# Patient Record
Sex: Male | Born: 1985 | Race: White | Hispanic: No | Marital: Single | Smoking: Never smoker
Health system: Southern US, Community
[De-identification: ages and names within clinical notes are randomized; demographics above are authoritative.]

## PROBLEM LIST (undated history)

## (undated) ENCOUNTER — Emergency Department (HOSPITAL_COMMUNITY): Payer: BLUE CROSS/BLUE SHIELD | Source: Home / Self Care

## (undated) DIAGNOSIS — B019 Varicella without complication: Secondary | ICD-10-CM

## (undated) DIAGNOSIS — F909 Attention-deficit hyperactivity disorder, unspecified type: Secondary | ICD-10-CM

## (undated) HISTORY — DX: Attention-deficit hyperactivity disorder, unspecified type: F90.9

## (undated) HISTORY — DX: Varicella without complication: B01.9

---
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
## 2005-10-09 ENCOUNTER — Ambulatory Visit: Payer: Self-pay | Admitting: Internal Medicine

## 2012-10-24 ENCOUNTER — Emergency Department (HOSPITAL_COMMUNITY): Payer: BC Managed Care – PPO

## 2012-10-24 ENCOUNTER — Emergency Department (HOSPITAL_COMMUNITY)
Admission: EM | Admit: 2012-10-24 | Discharge: 2012-10-24 | Disposition: A | Discharge status: Home / Self Care | Payer: BC Managed Care – PPO | Attending: Emergency Medicine | Admitting: Emergency Medicine

## 2012-10-24 ENCOUNTER — Encounter (HOSPITAL_COMMUNITY): Payer: Self-pay | Admitting: *Deleted

## 2012-10-24 DIAGNOSIS — S20212A Contusion of left front wall of thorax, initial encounter: Secondary | ICD-10-CM

## 2012-10-24 DIAGNOSIS — Y9389 Activity, other specified: Secondary | ICD-10-CM | POA: Insufficient documentation

## 2012-10-24 DIAGNOSIS — S20219A Contusion of unspecified front wall of thorax, initial encounter: Secondary | ICD-10-CM | POA: Insufficient documentation

## 2012-10-24 DIAGNOSIS — Y999 Unspecified external cause status: Secondary | ICD-10-CM | POA: Insufficient documentation

## 2012-10-24 DIAGNOSIS — Y92410 Unspecified street and highway as the place of occurrence of the external cause: Secondary | ICD-10-CM | POA: Insufficient documentation

## 2012-10-24 MED ORDER — METHOCARBAMOL 500 MG PO TABS: 500.0000 mg | ORAL_TABLET | Freq: Two times a day (BID) | ORAL | Status: DC

## 2012-10-24 MED ORDER — OXYCODONE-ACETAMINOPHEN 5-325 MG PO TABS: 2.0000 | ORAL_TABLET | ORAL | Status: DC | PRN

## 2012-10-24 MED ORDER — IBUPROFEN 600 MG PO TABS: 600.0000 mg | ORAL_TABLET | Freq: Four times a day (QID) | ORAL | Status: DC | PRN

## 2012-10-24 NOTE — ED Notes (Signed)
Patient is alert and oriented x3.  He was given DC instructions and follow up visit instructions.  Patient gave verbal understanding.  He was DC ambulatory under his own power to home.  V/S stable.  He was not showing any signs of distress on DC 
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
## 2014-07-21 ENCOUNTER — Encounter (HOSPITAL_COMMUNITY): Payer: Self-pay | Admitting: *Deleted

## 2014-07-21 ENCOUNTER — Emergency Department (HOSPITAL_COMMUNITY)
Admission: EM | Admit: 2014-07-21 | Discharge: 2014-07-21 | Disposition: A | Discharge status: Home / Self Care | Payer: BLUE CROSS/BLUE SHIELD | Attending: Emergency Medicine | Admitting: Emergency Medicine

## 2014-07-21 DIAGNOSIS — S65402A Unspecified injury of blood vessel of left thumb, initial encounter: Secondary | ICD-10-CM | POA: Diagnosis present

## 2014-07-21 DIAGNOSIS — Z79899 Other long term (current) drug therapy: Secondary | ICD-10-CM | POA: Diagnosis not present

## 2014-07-21 DIAGNOSIS — Y998 Other external cause status: Secondary | ICD-10-CM | POA: Diagnosis not present

## 2014-07-21 DIAGNOSIS — S61219A Laceration without foreign body of unspecified finger without damage to nail, initial encounter: Secondary | ICD-10-CM

## 2014-07-21 DIAGNOSIS — Y9389 Activity, other specified: Secondary | ICD-10-CM | POA: Diagnosis not present

## 2014-07-21 DIAGNOSIS — Y9289 Other specified places as the place of occurrence of the external cause: Secondary | ICD-10-CM | POA: Insufficient documentation

## 2014-07-21 DIAGNOSIS — W260XXA Contact with knife, initial encounter: Secondary | ICD-10-CM | POA: Insufficient documentation

## 2014-07-21 DIAGNOSIS — S61012A Laceration without foreign body of left thumb without damage to nail, initial encounter: Secondary | ICD-10-CM | POA: Diagnosis not present

## 2014-07-21 NOTE — ED Notes (Signed)
Thumb soaking in betadine and saline solution

## 2014-07-21 NOTE — ED Notes (Signed)
Pt c/o laceration to Left thumb; reports slicing a piece of bread when he cut his finger. ~1-2 cm lac noted to thumb, minimal bleeding noted

## 2014-07-21 NOTE — ED Notes (Signed)
The pt is c/o  A laceration to the lt thumb.  He did 30 minutes ago with a knife.  Minimal bleeding

## 2014-07-21 NOTE — Discharge Instructions (Signed)
Laceration Care, Adult °A laceration is a cut or lesion that goes through all layers of the skin and into the tissue just beneath the skin. °TREATMENT  °Some lacerations may not require closure. Some lacerations may not be able to be closed due to an increased risk of infection. It is important to see your caregiver as soon as possible after an injury to minimize the risk of infection and maximize the opportunity for successful closure. °If closure is appropriate, pain medicines may be given, if needed. The wound will be cleaned to help prevent infection. Your caregiver will use stitches (sutures), staples, wound glue (adhesive), or skin adhesive strips to repair the laceration. These tools bring the skin edges together to allow for faster healing and a better cosmetic outcome. However, all wounds will heal with a scar. Once the wound has healed, scarring can be minimized by covering the wound with sunscreen during the day for 1 full year. °HOME CARE INSTRUCTIONS  °For sutures or staples: °· Keep the wound clean and dry. °· If you were given a bandage (dressing), you should change it at least once a day. Also, change the dressing if it becomes wet or dirty, or as directed by your caregiver. °· Wash the wound with soap and water 2 times a day. Rinse the wound off with water to remove all soap. Pat the wound dry with a clean towel. °· After cleaning, apply a thin layer of the antibiotic ointment as recommended by your caregiver. This will help prevent infection and keep the dressing from sticking. °· You may shower as usual after the first 24 hours. Do not soak the wound in water until the sutures are removed. °· Only take over-the-counter or prescription medicines for pain, discomfort, or fever as directed by your caregiver. °· Get your sutures or staples removed as directed by your caregiver. °For skin adhesive strips: °· Keep the wound clean and dry. °· Do not get the skin adhesive strips wet. You may bathe  carefully, using caution to keep the wound dry. °· If the wound gets wet, pat it dry with a clean towel. °· Skin adhesive strips will fall off on their own. You may trim the strips as the wound heals. Do not remove skin adhesive strips that are still stuck to the wound. They will fall off in time. °For wound adhesive: °· You may briefly wet your wound in the shower or bath. Do not soak or scrub the wound. Do not swim. Avoid periods of heavy perspiration until the skin adhesive has fallen off on its own. After showering or bathing, gently pat the wound dry with a clean towel. °· Do not apply liquid medicine, cream medicine, or ointment medicine to your wound while the skin adhesive is in place. This may loosen the film before your wound is healed. °· If a dressing is placed over the wound, be careful not to apply tape directly over the skin adhesive. This may cause the adhesive to be pulled off before the wound is healed. °· Avoid prolonged exposure to sunlight or tanning lamps while the skin adhesive is in place. Exposure to ultraviolet light in the first year will darken the scar. °· The skin adhesive will usually remain in place for 5 to 10 days, then naturally fall off the skin. Do not pick at the adhesive film. °You may need a tetanus shot if: °· You cannot remember when you had your last tetanus shot. °· You have never had a tetanus   shot. If you get a tetanus shot, your arm may swell, get red, and feel warm to the touch. This is common and not a problem. If you need a tetanus shot and you choose not to have one, there is a rare chance of getting tetanus. Sickness from tetanus can be serious. SEEK MEDICAL CARE IF:   You have redness, swelling, or increasing pain in the wound.  You see a red line that goes away from the wound.  You have yellowish-white fluid (pus) coming from the wound.  You have a fever.  You notice a bad smell coming from the wound or dressing.  Your wound breaks open before or  after sutures have been removed.  You notice something coming out of the wound such as wood or glass.  Your wound is on your hand or foot and you cannot move a finger or toe. SEEK IMMEDIATE MEDICAL CARE IF:   Your pain is not controlled with prescribed medicine.  You have severe swelling around the wound causing pain and numbness or a change in color in your arm, hand, leg, or foot.  Your wound splits open and starts bleeding.  You have worsening numbness, weakness, or loss of function of any joint around or beyond the wound.  You develop painful lumps near the wound or on the skin anywhere on your body. MAKE SURE YOU:   Understand these instructions.  Will watch your condition.  Will get help right away if you are not doing well or get worse. Document Released: 05/18/2005 Document Revised: 08/10/2011 Document Reviewed: 11/11/2010 [facility] Patient Information 2015 [place], [place]. This information is not intended to replace advice given to you by your health care provider. Make sure you discuss any questions you have with your health care provider.   Emergency Department Resource Guide 1) Find a Doctor and Pay Out of Pocket Although you won't have to find out who is covered by your insurance plan, it is a good idea to ask around and get recommendations. You will then need to call the office and see if the doctor you have chosen will accept you as a new patient and what types of options they offer for patients who are self-pay. Some doctors offer discounts or will set up payment plans for their patients who do not have insurance, but you will need to ask so you aren't surprised when you get to your appointment.  2) Contact Your Local Health Department Not all health departments have doctors that can see patients for sick visits, but many do, so it is worth a call to see if yours does. If you don't know where your local health department is, you can check in your phone book. The CDC  also has a tool to help you locate your state's health department, and many state websites also have listings of all of their local health departments.  3) Find a [facility] If your illness is not likely to be very severe or complicated, you may want to try a walk in clinic. These are popping up all over the country in pharmacies, drugstores, and shopping centers. They're usually staffed by nurse practitioners or physician assistants that have been trained to treat common illnesses and complaints. They're usually fairly quick and inexpensive. However, if you have serious medical issues or chronic medical problems, these are probably not your best option.  No Primary Care Doctor: - Call Health Connect at  [phone] - they can help you locate a primary care doctor  that  accepts your insurance, provides certain services, etc. - Physician Referral Service- [phone]  Chronic Pain Problems: Organization         Address  Phone   Notes  [facility]  [phone] Patients need to be referred by their primary care doctor.   Medication Assistance: Organization         Address  Phone   Notes  [facility] Medication [facility] [address] [place], [place] [address] [phone] --Must be a resident of [facility] -- Must have NO insurance coverage whatsoever (no Medicaid/ Medicare, etc.) -- The pt. MUST have a primary care doctor that directs their care regularly and follows them in the community   MedAssist  [phone]   [employer]  [phone]    Agencies that provide inexpensive medical care: Organization         Address  Phone   Notes  [place]  [phone]   [facility]    [phone]   [facility] [place], [place] [address] [phone]   [place] [address], [place] [phone]   Planned Parenthood    [phone]   [facility]    [phone]   [place] and [place] [address], [place] Phone:  [phone], Fax:  [phone] Hours of Operation:  9 am - 6 pm, M-F.  Also accepts Medicaid/Medicare and self-pay.  [facility] for [place] [address], [address], [place] Phone: [phone], Fax: [phone]. Hours of Operation:  8:30 am - 5:30 pm, M-F.  Also accepts Medicaid and self-pay.  [facility] [place] [address] [place] Phone: [phone]   [place], [place], [place] [phone], Ext. [phone] Mondays & Thursdays: 7-9 AM.  First 15 patients are seen on a first come, first serve basis.    [place] Providers:  Organization         Address  Phone   Notes  [facility] [address], [place] [phone] Also accepts self-pay patients.  [facility] [address], [place]  [phone]   [place], [address], [place] [phone]   [facility] [address] [place] [phone]   Lucianne Lei [address], [place]   [phone] Only accepts [place] Access [place] patients after they have their name applied to their card.   Self-Pay (no insurance) in [facility]:  Organization         Address  Phone   Notes  Sickle Cell Patients, [facility] [place] [phone]   [facility] Urgent Care [place] [phone]   [facility] [place]  [place], [place], [place] [phone]   [facility]/Dr. Osei-Bonsu  [address] [place] or [address], [place] [phone] Phone number for both [place] and [place] locations is the same.  Urgent Medical and [facility] [address] [place] [phone]   [facility] [address], [place] or [address] [phone]  [facility] [place] [phone], phone; [phone], fax Sees patients 1st and 3rd Saturday of every month.  Must not qualify for public or private insurance (i.e. Medicaid, Medicare, [place] Health Choice, Veterans' Benefits)  Household income should be no more than 200% of the poverty level The clinic cannot treat you if you are pregnant or think you are pregnant  Sexually transmitted diseases are not treated at the clinic.    Dental Care: Organization         Address  Phone  Notes  [facility] Department of [facility] [place] [phone] Accepts children up to age 27 who are enrolled in [place] or [place]; pregnant women with a Medicaid card; and children who have applied for Medicaid or [place] Health Choice, but were declined, whose parents can pay a reduced fee at time of service.  [facility] Department of [facility]  [address], [place] [phone] Accepts children up to age 77 who are enrolled in [place] or [place]; pregnant women with a Medicaid card; and children who have applied for Medicaid or [place] Health Choice, but were declined, whose parents can pay a reduced fee at time of service.  [facility] Adult [facility] Access PRO[facility]  [place] [phone] Patients are seen by appointment only. Walk-ins are not accepted. [place] will see patients 102 years of age and older. Monday - Tuesday (8am-5pm) Most Wednesdays (8:30-5pm) $30 per visit, cash only  [facility] Adult Dental Access PROGRAM  [address], [facility] [phone] Patients are seen by appointment only. Walk-ins are not accepted. [place] will see patients 75 years of age and older. One  Wednesday Evening (Monthly: Volunteer Based).  $30 per visit, cash only  [place]  [phone] for adults; Children under age 59, call Graduate Pediatric Dentistry at [phone]. Children aged 56-14, please call [phone] to request a pediatric application.  Dental services are provided in all areas of dental care including fillings, crowns and bridges, complete and partial dentures, implants, gum treatment, root canals, and extractions. Preventive care is also provided. Treatment is provided to both adults and children. Patients are selected via a lottery and there is often a waiting list.   [facility] [address] [place]  [phone] [email or web address]   [facility] [address] [place], [place] [phone], Ext. [phone] Second and Fourth Thursday of each month, opens at 6:30 AM; Clinic ends at 9 AM.  Patients are seen on a first-come first-served basis, and a limited number are seen during each clinic.   [facility]  [address] [place], [place] [phone]   Eligibility Requirements You must have lived in [place], [place], or [place] counties for at least the last three months.   You cannot be eligible for state or federal sponsored [employer], including [employer], [place], or [employer].   You generally cannot be eligible for healthcare insurance through your employer.    How to apply: Eligibility screenings are held every Tuesday and Wednesday afternoon from 1:00 pm until 4:00 pm. You do not need an appointment for the interview!  [facility] [address] [place], [place]   [place]  Painter Department  [place] Department  [place] in the Community: Intensive Outpatient Programs Organization          Address  Phone  Notes  [place]. [address], [place], [place] [phone]   [facility] Outpatient [address], [place], [place]   ADS: Alcohol & Drug Svcs [address] [place], [place]   [place] [address]  [place], [place] or [phone]   Substance Abuse Resources Organization         Address  Phone  Notes  Alcohol and Drug Services  [phone]   [place]  [phone]   [facility] [place]   Chinita Pester  [phone]   Residential & Outpatient Substance Abuse Program  [phone]   Psychological Services Organization         Address  Phone  Notes  [facility]  [place]  [phone]   [place] [address] [place] or [phone]    Mobile Crisis Teams Organization         Address  Phone  Notes  [facility], Mobile Crisis Care Unit  [phone]   [facility]  [address] [place], [place]   [place] [address] [place] [phone]    Self-Help/Support Groups Organization         Address  Phone             Notes  [place]. of [place] - variety of support groups  [place] Call for more information  Narcotics Anonymous (NA), [facility] [address], [employer]   2 meetings at this location   Special educational needs teacher         Address  Phone  Notes  [facility] [place],    [place]  [phone]   [facility]  [address], [place] [address], [place], [place]   [place], [place] [phone] Admissions: 8am-3pm M-F  Incentives Substance [place] [address],    [place], [place] [phone]   [facility] [address] [place], [place], [place]   [facility] [address],  [place], [place]   [facility] Programs - Intensive Outpatient [address] Kristeen Mans [address], [place], [place]   [facility] ([place].,  [place], [place] [phone] or [phone]   [facility] Services (RTS) [address] [place], [place] Accepts Medicaid  [facility] [address]  [place] [phone] Substance Abuse/Addiction Treatment   [facility] Organization         Address  Phone  Notes  [facility]  [phone]   Domenic Schwab, PhD [address] [place]lis [place], [place]   [phone] or [phone]   [place], [place] [phone]   [place] [address] [place], [place] [phone] Insurance/Medicaid/sponsorship through [employer] and Families [address], [place]                                    [place], [place] [phone] [place] [phone]  [address], [place] [phone]    Dr. Adele Schilder  [phone]   [facility] Dept. 1) [address] [place] 2) [place] 3)  [place] [address], [place] [phone]  [phone]   [place] [phone] or [phone] (After Hours)

## 2014-07-21 NOTE — ED Provider Notes (Signed)
CSN: [id number]     Arrival date & time 07/21/14  2229 History  This chart was scribed for a non-physician practitioner, Starlyn Skeans, PA-C working with Evelina Bucy, MD by Martinique Peace, ED Scribe. The patient was seen in TR07C/TR07C. The patient's care was started at 10:48 PM.     Chief Complaint  Patient presents with  . Laceration     Patient is a 29 y.o. male presenting with skin laceration. The history is provided by the patient. No language interpreter was used.  Laceration Location:  Hand Hand laceration location:  L finger (left thumb) Time since incident:  30 minutes Laceration mechanism:  Knife Tetanus status:  Up to date  HPI Comments: Brian Herrera is a 29 y.o. male who presents to the Emergency Department complaining of laceration to left thumb onset 30 minutes ago that occurred while pt was slicing a piece of bread, bleeding is controlled. Pt is left hand dominant. Rates pain as 2/10 currently. Pt reports some tingling to tip of thumb where laceration occurred. Tetanus vaccination is up to date. Pt is non-smoker.    History reviewed. No pertinent past medical history. History reviewed. No pertinent past surgical history. No family history on file. History  Substance Use Topics  . Smoking status: Never Smoker   . Smokeless tobacco: Not on file  . Alcohol Use: Yes     Comment: socially    Review of Systems  Constitutional: Negative for fever and chills.  Gastrointestinal: Negative for vomiting.  Skin: Positive for wound.       Laceration to tip of left thumb.   All other systems reviewed and are negative.     Allergies  Review of patient's allergies indicates not on file.  Home Medications   Prior to Admission medications   Medication Sig Start Date End Date Taking? Authorizing Provider  ibuprofen (ADVIL,MOTRIN) 600 MG tablet Take 1 tablet (600 mg total) by mouth every 6 (six) hours as needed for pain. 10/24/12   Leota Jacobsen, MD  methocarbamol  (ROBAXIN) 500 MG tablet Take 1 tablet (500 mg total) by mouth 2 (two) times daily. 10/24/12   Leota Jacobsen, MD  oxyCODONE-acetaminophen (PERCOCET/ROXICET) 5-325 MG per tablet Take 2 tablets by mouth every 4 (four) hours as needed for pain. 10/24/12   Leota Jacobsen, MD   BP 134/88 mmHg  Pulse 53  Temp(Src) 98.1 F (36.7 C) (Oral)  Resp 16  Ht 5\' 5"  (1.651 m)  Wt 195 lb (88.451 kg)  BMI 32.45 kg/m2  SpO2 98% Physical Exam  Constitutional: He is oriented to person, place, and time. He appears well-developed and well-nourished. No distress.  HENT:  Head: Normocephalic and atraumatic.  Eyes: Conjunctivae and EOM are normal.  Neck: Neck supple. No tracheal deviation present.  Cardiovascular: Normal rate, regular rhythm, normal heart sounds and intact distal pulses.  Exam reveals no gallop and no friction rub.   No murmur heard. Pulmonary/Chest: Effort normal and breath sounds normal. No respiratory distress. He has no wheezes. He has no rales. He exhibits no tenderness.  Musculoskeletal: Normal range of motion.       Left hand: He exhibits laceration. He exhibits normal range of motion, no tenderness, no bony tenderness, normal two-point discrimination, normal capillary refill, no deformity and no swelling. Normal sensation noted. Normal strength noted.       Hands: There is full flexion and extension of the thumb with resistance.  Neurological: He is alert and oriented to person, place,  and time.  Skin: Skin is warm and dry.  Psychiatric: He has a normal mood and affect. His behavior is normal. Judgment and thought content normal.  Nursing note and vitals reviewed.   ED Course  Procedures (including critical care time) Labs Review Labs Reviewed - No data to display  Imaging Review No results found.   EKG Interpretation None     Medications - No data to display  10:52 PM- Treatment plan was discussed with patient who verbalizes understanding and agrees.   MDM   Final  diagnoses:  Finger laceration, initial encounter   Patient's 29 year old male who presents emergency room for evaluation of laceration to the thumb. Laceration is clean. There appears to be no tendinous or nerve involvement. It is neurovascularly intact. Patient's up-to-date on his vaccinations and tetanus. Patient has full flexion and extension of thumb. Wound appears to be relatively superficial. Wound was soaked here in the ED and Betadine solution. Wound was closed with Dermabond as seen above. Patient return for signs of infection which we discussed. Patient states understanding and agreement. Patient stable for discharge.  I personally performed the services described in this documentation, which was scribed in my presence. The recorded information has been reviewed and is accurate.       Cherylann Parr, PA-C 07/21/14 2347  Evelina Bucy, MD 07/21/14 [phone]

## 2014-12-18 ENCOUNTER — Encounter: Payer: Self-pay | Admitting: Family

## 2014-12-18 ENCOUNTER — Ambulatory Visit (INDEPENDENT_AMBULATORY_CARE_PROVIDER_SITE_OTHER): Payer: BLUE CROSS/BLUE SHIELD | Admitting: Family

## 2014-12-18 VITALS — BP 128/80 | HR 66 | Temp 98.2°F | Resp 18 | Ht 67.0 in | Wt 184.0 lb

## 2014-12-18 DIAGNOSIS — D179 Benign lipomatous neoplasm, unspecified: Secondary | ICD-10-CM

## 2014-12-18 DIAGNOSIS — Z0000 Encounter for general adult medical examination without abnormal findings: Secondary | ICD-10-CM | POA: Insufficient documentation

## 2014-12-18 NOTE — Assessment & Plan Note (Addendum)
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 Continue to monitor at this time. Follow-up for changes in size or worsening.

## 2014-12-18 NOTE — Assessment & Plan Note (Signed)
1) Anticipatory Guidance: Discussed importance of wearing a seatbelt while driving and not texting while driving; changing batteries in smoke detector at least once annually; wearing suntan lotion when outside; eating a balanced and moderate diet; getting physical activity at least 30 minutes per day.  2) Immunizations / Screenings / Labs:  All immunizations are up-to-date per recommendations. Due for a dental and vision screen which will be scheduled independently. All other screenings are up-to-date per recommendations. Obtain CBC, BMET, Lipid profile and TSH.   Overall well exam with minimal risk factors for cardiovascular disease. BMI indicates slight overweight. Discussed goal of losing approximately 5% of current body weight through continued nutrition and physical activity. Follow-up prevention exam in one year. Follow-up office visit pending blood work.

## 2014-12-18 NOTE — Progress Notes (Signed)
Subjective:    Patient ID: Brian Herrera, male    DOB: [birth year], 29 y.o.   MRN: [id number]  Chief Complaint  Patient presents with  . Establish Care    has a mass on the back on his left leg, noticed a couple weeks ago    HPI:  Brian Herrera is a 29 y.o. male who presents today for an annual wellness visit.   1) Health Maintenance -   Diet - Averages about 2-3 meals per day consisting of fruits, vegetables, protein, lean meats; 2-3 cups of caffeine per day  Exercise - Daily consisting of walking, running and resistance training .   2) Preventative Exams / Immunizations:  Dental -- Due for exam  Vision -- Due for exam   There are no preventive care reminders to display for this patient.   There is no immunization history on file for this patient.   3.) Masses - Associated symptom of masses located on left leg, left gluteus, and right bicep which has been going on for about 2 weeks. Denies any changes in size. Described as soft and moveable. Denies any tenderness. Denies treatments that make it better or worse.   No Known Allergies   Outpatient Prescriptions Prior to Visit  Medication Sig Dispense Refill  . ibuprofen (ADVIL,MOTRIN) 600 MG tablet Take 1 tablet (600 mg total) by mouth every 6 (six) hours as needed for pain. 30 tablet 0  . methocarbamol (ROBAXIN) 500 MG tablet Take 1 tablet (500 mg total) by mouth 2 (two) times daily. 20 tablet 0  . oxyCODONE-acetaminophen (PERCOCET/ROXICET) 5-325 MG per tablet Take 2 tablets by mouth every 4 (four) hours as needed for pain. 10 tablet 0   No facility-administered medications prior to visit.     Past Medical History  Diagnosis Date  . Chicken pox   . ADHD (attention deficit hyperactivity disorder)     Dx around age 62     History reviewed. No pertinent past surgical history.   Family History  Problem Relation Age of Onset  . Arthritis Mother   . Diabetes Father   . Arthritis Maternal Grandmother   .  Hypertension Maternal Grandmother   . Mental illness Maternal Grandfather   . Hypertension Paternal Grandmother   . Diabetes Paternal Grandmother   . Alcohol abuse Paternal Grandfather   . Stroke Paternal Grandfather   . Diabetes Paternal Grandfather      History   Social History  . Marital Status: Single    Spouse Name: N/A  . Number of Children: N/A  . Years of Education: 16   Occupational History  . Nurse Tech    . Student     [employer]ing School   Social History Main Topics  . Smoking status: Never Smoker   . Smokeless tobacco: Never Used  . Alcohol Use: Yes     Comment: socially  . Drug Use: No  . Sexual Activity: Not on file   Other Topics Concern  . Not on file   Social History Narrative   Fun: Paintball, exercise   Denies religious beliefs effecting health care.      Review of Systems  Constitutional: Denies fever, chills, fatigue, or significant weight gain/loss. HENT: Head: Denies headache or neck pain Ears: Denies changes in hearing, ringing in ears, earache, drainage Nose: Denies discharge, stuffiness, itching, nosebleed, sinus pain Throat: Denies sore throat, hoarseness, dry mouth, sores, thrush Eyes: Denies loss/changes in vision, pain, redness, blurry/double vision, flashing lights Cardiovascular: Denies  chest pain/discomfort, tightness, palpitations, shortness of breath with activity, difficulty lying down, swelling, sudden awakening with shortness of breath Respiratory: Denies shortness of breath, cough, sputum production, wheezing Gastrointestinal: Denies dysphasia, heartburn, change in appetite, nausea, change in bowel habits, rectal bleeding, constipation, diarrhea, yellow skin or eyes Genitourinary: Denies frequency, urgency, burning/pain, blood in urine, incontinence, change in urinary strength. Musculoskeletal: Denies muscle/joint pain, stiffness, back pain, redness or swelling of joints, trauma Skin: Denies rashes, lumps, itching, dryness, color  changes, or hair/nail changes Neurological: Denies dizziness, fainting, seizures, weakness, numbness, tingling, tremor Psychiatric - Denies nervousness, stress, depression or memory loss Endocrine: Denies heat or cold intolerance, sweating, frequent urination, excessive thirst, changes in appetite Hematologic: Denies ease of bruising or bleeding     Objective:     BP 128/80 mmHg  Pulse 66  Temp(Src) 98.2 F (36.8 C) (Oral)  Resp 18  Ht 5\' 7"  (1.702 m)  Wt 184 lb (83.462 kg)  BMI 28.81 kg/m2  SpO2 98% Nursing note and vital signs reviewed.  Physical Exam  Constitutional: He is oriented to person, place, and time. He appears well-developed and well-nourished.  HENT:  Head: Normocephalic.  Right Ear: Hearing, tympanic membrane, external ear and ear canal normal.  Left Ear: Hearing, tympanic membrane, external ear and ear canal normal.  Nose: Nose normal.  Mouth/Throat: Uvula is midline, oropharynx is clear and moist and mucous membranes are normal.  Eyes: Conjunctivae and EOM are normal. Pupils are equal, round, and reactive to light.  Neck: Neck supple. No JVD present. No tracheal deviation present. No thyromegaly present.  Cardiovascular: Normal rate, regular rhythm, normal heart sounds and intact distal pulses.   Pulmonary/Chest: Effort normal and breath sounds normal.  Abdominal: Soft. Bowel sounds are normal. He exhibits no distension and no mass. There is no tenderness. There is no rebound and no guarding.  Musculoskeletal: Normal range of motion. He exhibits no edema or tenderness.  Lymphadenopathy:    He has no cervical adenopathy.  Neurological: He is alert and oriented to person, place, and time. He has normal reflexes. No cranial nerve deficit. He exhibits normal muscle tone. Coordination normal.  Skin: Skin is warm and dry.  Psychiatric: He has a normal mood and affect. His behavior is normal. Judgment and thought content normal.       Assessment & Plan:    Problem List Items Addressed This Visit      Other   Routine general medical examination at a health care facility - Primary    1) Anticipatory Guidance: Discussed importance of wearing a seatbelt while driving and not texting while driving; changing batteries in smoke detector at least once annually; wearing suntan lotion when outside; eating a balanced and moderate diet; getting physical activity at least 30 minutes per day.  2) Immunizations / Screenings / Labs:  All immunizations are up-to-date per recommendations. Due for a dental and vision screen which will be scheduled independently. All other screenings are up-to-date per recommendations. Obtain CBC, BMET, Lipid profile and TSH.   Overall well exam with minimal risk factors for cardiovascular disease. BMI indicates slight overweight. Discussed goal of losing approximately 5% of current body weight through continued nutrition and physical activity. Follow-up prevention exam in one year. Follow-up office visit pending blood work.       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 Continue  to monitor at this time. Follow-up for changes in size or worsening.

## 2014-12-18 NOTE — Patient Instructions (Signed)
Thank you for choosing [facility].  Summary/Instructions:  Please stop by the lab on the basement level of the building for your blood work. Your results will be released to [facility] (or called to you) after review, usually within 72 hours after test completion. If any changes need to be made, you will be notified at that same time.  If your symptoms worsen or fail to improve, please contact our office for further instruction, or in case of emergency go directly to the emergency room at the closest medical facility.   Health Maintenance A healthy lifestyle and preventative care can promote health and wellness.  Maintain regular health, dental, and eye exams.  Eat a healthy diet. Foods like vegetables, fruits, whole grains, low-fat dairy products, and lean protein foods contain the nutrients you need and are low in calories. Decrease your intake of foods high in solid fats, added sugars, and salt. Get information about a proper diet from your health care provider, if necessary.  Regular physical exercise is one of the most important things you can do for your health. Most adults should get at least 150 minutes of moderate-intensity exercise (any activity that increases your heart rate and causes you to sweat) each week. In addition, most adults need muscle-strengthening exercises on 2 or more days a week.   Maintain a healthy weight. The body mass index (BMI) is a screening tool to identify possible weight problems. It provides an estimate of body fat based on height and weight. Your health care provider can find your BMI and can help you achieve or maintain a healthy weight. For males 20 years and older:  A BMI below 18.5 is considered underweight.  A BMI of 18.5 to 24.9 is normal.  A BMI of 25 to 29.9 is considered overweight.  A BMI of 30 and above is considered obese.  Maintain normal blood lipids and cholesterol by exercising and minimizing your intake of saturated fat. Eat a  balanced diet with plenty of fruits and vegetables. Blood tests for lipids and cholesterol should begin at age 74 and be repeated every 5 years. If your lipid or cholesterol levels are high, you are over age 23, or you are at high risk for heart disease, you may need your cholesterol levels checked more frequently.Ongoing high lipid and cholesterol levels should be treated with medicines if diet and exercise are not working.  If you smoke, find out from your health care provider how to quit. If you do not use tobacco, do not start.  Lung cancer screening is recommended for adults aged 68-80 years who are at high risk for developing lung cancer because of a history of smoking. A yearly low-dose CT scan of the lungs is recommended for people who have at least a 30-pack-year history of smoking and are current smokers or have quit within the past 15 years. A pack year of smoking is smoking an average of 1 pack of cigarettes a day for 1 year (for example, a 30-pack-year history of smoking could mean smoking 1 pack a day for 30 years or 2 packs a day for 15 years). Yearly screening should continue until the smoker has stopped smoking for at least 15 years. Yearly screening should be stopped for people who develop a health problem that would prevent them from having lung cancer treatment.  If you choose to drink alcohol, do not have more than 2 drinks per day. One drink is considered to be 12 oz (360 mL) of beer,  5 oz (150 mL) of wine, or 1.5 oz (45 mL) of liquor.  Avoid the use of street drugs. Do not share needles with anyone. Ask for help if you need support or instructions about stopping the use of drugs.  High blood pressure causes heart disease and increases the risk of stroke. Blood pressure should be checked at least every 1-2 years. Ongoing high blood pressure should be treated with medicines if weight loss and exercise are not effective.  If you are 24-31 years old, ask your health care provider if  you should take aspirin to prevent heart disease.  Diabetes screening involves taking a blood sample to check your fasting blood sugar level. This should be done once every 3 years after age 49 if you are at a normal weight and without risk factors for diabetes. Testing should be considered at a younger age or be carried out more frequently if you are overweight and have at least 1 risk factor for diabetes.  Colorectal cancer can be detected and often prevented. Most routine colorectal cancer screening begins at the age of 64 and continues through age 48. However, your health care provider may recommend screening at an earlier age if you have risk factors for colon cancer. On a yearly basis, your health care provider may provide home test kits to check for hidden blood in the stool. A small camera at the end of a tube may be used to directly examine the colon (sigmoidoscopy or colonoscopy) to detect the earliest forms of colorectal cancer. Talk to your health care provider about this at age 75 when routine screening begins. A direct exam of the colon should be repeated every 5-10 years through age 60, unless early forms of precancerous polyps or small growths are found.  People who are at an increased risk for hepatitis B should be screened for this virus. You are considered at high risk for hepatitis B if:  You were born in a country where hepatitis B occurs often. Talk with your health care provider about which countries are considered high risk.  Your parents were born in a high-risk country and you have not received a shot to protect against hepatitis B (hepatitis B vaccine).  You have HIV or AIDS.  You use needles to inject street drugs.  You live with, or have sex with, someone who has hepatitis B.  You are a man who has sex with other men (MSM).  You get hemodialysis treatment.  You take certain medicines for conditions like cancer, organ transplantation, and autoimmune  conditions.  Hepatitis C blood testing is recommended for all people born from [birth year] through 1965 and any individual with known risk factors for hepatitis C.  Healthy men should no longer receive prostate-specific antigen (PSA) blood tests as part of routine cancer screening. Talk to your health care provider about prostate cancer screening.  Testicular cancer screening is not recommended for adolescents or adult males who have no symptoms. Screening includes self-exam, a health care provider exam, and other screening tests. Consult with your health care provider about any symptoms you have or any concerns you have about testicular cancer.  Practice safe sex. Use condoms and avoid high-risk sexual practices to reduce the spread of sexually transmitted infections (STIs).  You should be screened for STIs, including gonorrhea and chlamydia if:  You are sexually active and are younger than 24 years.  You are older than 24 years, and your health care provider tells you that you are at  risk for this type of infection.  Your sexual activity has changed since you were last screened, and you are at an increased risk for chlamydia or gonorrhea. Ask your health care provider if you are at risk.  If you are at risk of being infected with HIV, it is recommended that you take a prescription medicine daily to prevent HIV infection. This is called pre-exposure prophylaxis (PrEP). You are considered at risk if:  You are a man who has sex with other men (MSM).  You are a heterosexual man who is sexually active with multiple partners.  You take drugs by injection.  You are sexually active with a partner who has HIV.  Talk with your health care provider about whether you are at high risk of being infected with HIV. If you choose to begin PrEP, you should first be tested for HIV. You should then be tested every 3 months for as long as you are taking PrEP.  Use sunscreen. Apply sunscreen liberally and  repeatedly throughout the day. You should seek shade when your shadow is shorter than you. Protect yourself by wearing long sleeves, pants, a wide-brimmed hat, and sunglasses year round whenever you are outdoors.  Tell your health care provider of new moles or changes in moles, especially if there is a change in shape or color. Also, tell your health care provider if a mole is larger than the size of a pencil eraser.  A one-time screening for abdominal aortic aneurysm (AAA) and surgical repair of large AAAs by ultrasound is recommended for men aged 35-75 years who are current or former smokers.  Stay current with your vaccines (immunizations). Document Released: 11/14/2007 Document Revised: 05/23/2013 Document Reviewed: 10/13/2010 [facility] Patient Information 2015 [place], [place]. This information is not intended to replace advice given to you by your health care provider. Make sure you discuss any questions you have with your health care provider.

## 2014-12-19 ENCOUNTER — Encounter: Payer: Self-pay | Admitting: Family

## 2014-12-19 ENCOUNTER — Other Ambulatory Visit (INDEPENDENT_AMBULATORY_CARE_PROVIDER_SITE_OTHER): Payer: BLUE CROSS/BLUE SHIELD

## 2014-12-19 DIAGNOSIS — Z0000 Encounter for general adult medical examination without abnormal findings: Secondary | ICD-10-CM | POA: Diagnosis not present

## 2014-12-19 LAB — BASIC METABOLIC PANEL
BUN: 25 mg/dL — AB (ref 6–23)
CO2: 25 mEq/L (ref 19–32)
CREATININE: 0.84 mg/dL (ref 0.40–1.50)
Calcium: 9.5 mg/dL (ref 8.4–10.5)
Chloride: 105 mEq/L (ref 96–112)
GFR: 114.58 mL/min (ref >60.00)
Glucose, Bld: 91 mg/dL (ref 70–99)
POTASSIUM: 3.9 meq/L (ref 3.5–5.1)
Sodium: 139 mEq/L (ref 135–145)

## 2014-12-19 LAB — LIPID PANEL
CHOL/HDL RATIO: 4
Cholesterol: 171 mg/dL (ref 0–200)
HDL: 39.7 mg/dL (ref >39.00)
LDL CALC: 114 mg/dL — AB (ref 0–99)
NonHDL: 131.3
TRIGLYCERIDES: 85 mg/dL (ref 0.0–149.0)
VLDL: 17 mg/dL (ref 0.0–40.0)

## 2014-12-19 LAB — CBC
HCT: 43.3 % (ref 39.0–52.0)
HEMOGLOBIN: 14.7 g/dL (ref 13.0–17.0)
MCHC: 33.9 g/dL (ref 30.0–36.0)
MCV: 86.4 fl (ref 78.0–100.0)
PLATELETS: 285 10*3/uL (ref 150.0–400.0)
RBC: 5.01 Mil/uL (ref 4.22–5.81)
RDW: 13.3 % (ref 11.5–15.5)
WBC: 8 10*3/uL (ref 4.0–10.5)

## 2014-12-19 LAB — TSH: TSH: 1.63 u[IU]/mL (ref 0.35–4.50)

## 2015-05-24 ENCOUNTER — Emergency Department (HOSPITAL_COMMUNITY): Payer: BLUE CROSS/BLUE SHIELD

## 2015-05-24 ENCOUNTER — Encounter (HOSPITAL_COMMUNITY): Payer: Self-pay | Admitting: *Deleted

## 2015-05-24 ENCOUNTER — Emergency Department (HOSPITAL_COMMUNITY)
Admission: EM | Admit: 2015-05-24 | Discharge: 2015-05-24 | Disposition: A | Discharge status: Home / Self Care | Payer: BLUE CROSS/BLUE SHIELD | Attending: Emergency Medicine | Admitting: Emergency Medicine

## 2015-05-24 DIAGNOSIS — M546 Pain in thoracic spine: Secondary | ICD-10-CM

## 2015-05-24 DIAGNOSIS — R42 Dizziness and giddiness: Secondary | ICD-10-CM | POA: Insufficient documentation

## 2015-05-24 DIAGNOSIS — Z8659 Personal history of other mental and behavioral disorders: Secondary | ICD-10-CM | POA: Insufficient documentation

## 2015-05-24 DIAGNOSIS — Z8619 Personal history of other infectious and parasitic diseases: Secondary | ICD-10-CM | POA: Insufficient documentation

## 2015-05-24 MED ORDER — CYCLOBENZAPRINE HCL 10 MG PO TABS: 10.0000 mg | ORAL_TABLET | Freq: Two times a day (BID) | ORAL | Status: AC | PRN

## 2015-05-24 MED ORDER — CYCLOBENZAPRINE HCL 10 MG PO TABS
5.0000 mg | ORAL_TABLET | Freq: Once | ORAL | Status: AC
  Administered 2015-05-24: 5 mg via ORAL
  Filled 2015-05-24: qty 1

## 2015-05-24 MED ORDER — KETOROLAC TROMETHAMINE 30 MG/ML IJ SOLN
30.0000 mg | Freq: Once | INTRAMUSCULAR | Status: AC
  Administered 2015-05-24: 30 mg via INTRAMUSCULAR
  Filled 2015-05-24: qty 1

## 2015-05-24 NOTE — ED Notes (Signed)
Pt was not the pt  Registered by mistake

## 2015-05-24 NOTE — ED Provider Notes (Signed)
CSN: [id number]     Arrival date & time 05/24/15  1426 History   First MD Initiated Contact with Patient 05/24/15 1434     Chief Complaint  Patient presents with  . Flank Pain  . Dizziness    HPI   29 year old male presents today with acute onset of right mid back pain. Patient reports that he was sitting resting approximately 1 hour prior to arrival in the ED when he felt a sharp pain to the right mid back. He reports pain is exacerbated by forward flexion and left lateral rotation of the spine. He states that he tender to palpation or deep inspiration. Patient had difficulty ambulating due to the pain prior to arrival. He denies any shortness of breath but reports the deep inspiration causes significant pain. No medications prior to arrival. No air hunger, recent respiratory infections, fever, chills, lower extremity swelling or edema, history DVT or PE, recent prolonged immobilization, malignancy or recent surgery. History of the same that resolved without intervention, although this is more severe than previous. Patient reports that he was working out his back 2 days ago but has not had any pain since then.  Past Medical History  Diagnosis Date  . Chicken pox   . ADHD (attention deficit hyperactivity disorder)     Dx around age 19   History reviewed. No pertinent past surgical history. Family History  Problem Relation Age of Onset  . Arthritis Mother   . Diabetes Father   . Arthritis Maternal Grandmother   . Hypertension Maternal Grandmother   . Mental illness Maternal Grandfather   . Hypertension Paternal Grandmother   . Diabetes Paternal Grandmother   . Alcohol abuse Paternal Grandfather   . Stroke Paternal Grandfather   . Diabetes Paternal Grandfather    Social History  Substance Use Topics  . Smoking status: Never Smoker   . Smokeless tobacco: Never Used  . Alcohol Use: Yes     Comment: socially    Review of Systems  All other systems reviewed and are  negative.   Allergies  Review of patient's allergies indicates no known allergies.  Home Medications   Prior to Admission medications   Medication Sig Start Date End Date Taking? Authorizing Provider  ibuprofen (ADVIL,MOTRIN) 200 MG tablet Take 400 mg by mouth every 6 (six) hours as needed for moderate pain.   Yes Historical Provider, MD  cyclobenzaprine (FLEXERIL) 10 MG tablet Take 1 tablet (10 mg total) by mouth 2 (two) times daily as needed for muscle spasms. 05/24/15   Crockett Rallo, PA-C   BP 118/93 mmHg  Pulse 74  Temp(Src) 97.6 F (36.4 C) (Oral)  Resp 18  SpO2 99%   Physical Exam  Constitutional: He is oriented to person, place, and time. He appears well-developed and well-nourished.  HENT:  Head: Normocephalic and atraumatic.  Eyes: Conjunctivae are normal. Pupils are equal, round, and reactive to light. Right eye exhibits no discharge. Left eye exhibits no discharge. No scleral icterus.  Neck: Normal range of motion. No JVD present. No tracheal deviation present.  Cardiovascular: Normal rate, regular rhythm, normal heart sounds and intact distal pulses.  Exam reveals no gallop and no friction rub.   No murmur heard. Pulmonary/Chest: Effort normal and breath sounds normal. No stridor. No respiratory distress. He has no wheezes. He has no rales. He exhibits no tenderness.  Abdominal: Soft. He exhibits no distension and no mass. There is no tenderness. There is no rebound and no guarding.  Musculoskeletal: Normal range  of motion. He exhibits no edema or tenderness.  Mid back right lateral musculature exquisitely tender to palpation, no signs of trauma. Patient has no CT or L-spine tenderness. Pain is made worse with forced flexion and left lateral rotation.  Neurological: He is alert and oriented to person, place, and time. Coordination normal.  Skin: Skin is warm and dry. No rash noted. No erythema. No pallor.  Psychiatric: He has a normal mood and affect. His behavior is  normal. Judgment and thought content normal.  Nursing note and vitals reviewed.   ED Course  Procedures (including critical care time) Labs Review Labs Reviewed - No data to display  Imaging Review Dg Chest 2 View  05/24/2015  CLINICAL DATA:  Back pain and shortness of breath for 1 hour EXAM: CHEST - 2 VIEW COMPARISON:  None. FINDINGS: The heart size and mediastinal contours are within normal limits. Both lungs are clear. The visualized skeletal structures are unremarkable. IMPRESSION: No active disease. Electronically Signed   By: Inez Catalina M.D.   On: 05/24/2015 15:05   I have personally reviewed and evaluated these images and lab results as part of my medical decision-making.   EKG Interpretation None      MDM   Final diagnoses:  Right-sided thoracic back pain   Labs:    Imaging: DG chest 2 view  Consults:  Therapeutics: Flexeril, Toradol  Discharge Meds:  Flexeril  Assessment/Plan: 29 year old male presents today with acute onset back pain. Today's presentation most likely represents muscular etiology as he is exquisitely tender to even light palpation of the back with tense muscles. This is made worse with 4 flexion and left lateral rotation. Patient does have pain with deep inspiration, but maintains nearly perfect oxygen saturation, is not tachycardic and he has no risk factors for DVT or PE. Patient is smiling and laughing throughout evaluation he appears to be in no acute distress. He'll be discharged home with above medications, he is given strict return precautions, he verbalized understanding and agreement to today's plan and had no further questions or concerns at time of discharge         Okey Regal, PA-C 05/24/15 [address] PA-C 05/24/15 1603  Leo Grosser, MD 05/25/15 [phone]

## 2015-05-24 NOTE — ED Notes (Signed)
Patient transported to X-ray 

## 2015-05-24 NOTE — ED Notes (Signed)
Pt reports sudden onset of right side back pain x 1 hour ago. Has dizziness and nausea from pain. Denies any urinary symptoms.

## 2015-05-24 NOTE — Discharge Instructions (Signed)
Back Pain, Adult °Back pain is very common in adults. The cause of back pain is rarely dangerous and the pain often gets better over time. The cause of your back pain may not be known. Some common causes of back pain include: °· Strain of the muscles or ligaments supporting the spine. °· Wear and tear (degeneration) of the spinal disks. °· Arthritis. °· Direct injury to the back. °For many people, back pain may return. Since back pain is rarely dangerous, most people can learn to manage this condition on their own. °HOME CARE INSTRUCTIONS °Watch your back pain for any changes. The following actions may help to lessen any discomfort you are feeling: °· Remain active. It is stressful on your back to sit or stand in one place for long periods of time. Do not sit, drive, or stand in one place for more than 30 minutes at a time. Take short walks on even surfaces as soon as you are able. Try to increase the length of time you walk each day. °· Exercise regularly as directed by your health care provider. Exercise helps your back heal faster. It also helps avoid future injury by keeping your muscles strong and flexible. °· Do not stay in bed. Resting more than 1-2 days can delay your recovery. °· Pay attention to your body when you bend and lift. The most comfortable positions are those that put less stress on your recovering back. Always use proper lifting techniques, including: °¨ Bending your knees. °¨ Keeping the load close to your body. °¨ Avoiding twisting. °· Find a comfortable position to sleep. Use a firm mattress and lie on your side with your knees slightly bent. If you lie on your back, put a pillow under your knees. °· Avoid feeling anxious or stressed. Stress increases muscle tension and can worsen back pain. It is important to recognize when you are anxious or stressed and learn ways to manage it, such as with exercise. °· Take medicines only as directed by your health care provider. Over-the-counter  medicines to reduce pain and inflammation are often the most helpful. Your health care provider may prescribe muscle relaxant drugs. These medicines help dull your pain so you can more quickly return to your normal activities and healthy exercise. °· Apply ice to the injured area: °¨ Put ice in a plastic bag. °¨ Place a towel between your skin and the bag. °¨ Leave the ice on for 20 minutes, 2-3 times a day for the first 2-3 days. After that, ice and heat may be alternated to reduce pain and spasms. °· Maintain a healthy weight. Excess weight puts extra stress on your back and makes it difficult to maintain good posture. °SEEK MEDICAL CARE IF: °· You have pain that is not relieved with rest or medicine. °· You have increasing pain going down into the legs or buttocks. °· You have pain that does not improve in one week. °· You have night pain. °· You lose weight. °· You have a fever or chills. °SEEK IMMEDIATE MEDICAL CARE IF:  °· You develop new bowel or bladder control problems. °· You have unusual weakness or numbness in your arms or legs. °· You develop nausea or vomiting. °· You develop abdominal pain. °· You feel faint. °  °This information is not intended to replace advice given to you by your health care provider. Make sure you discuss any questions you have with your health care provider. °  °Document Released: 05/18/2005 Document Revised: 06/08/2014 Document Reviewed: 09/19/2013 °Elsevier Interactive Patient Education ©2016 Elsevier   Inc. ° °Please read attached information. If you experience any new or worsening signs or symptoms please return to the emergency room for evaluation. Please follow-up with your primary care provider or specialist as discussed. Please use medication prescribed only as directed and discontinue taking if you have any concerning signs or symptoms.  °

## 2015-06-20 ENCOUNTER — Ambulatory Visit (INDEPENDENT_AMBULATORY_CARE_PROVIDER_SITE_OTHER): Payer: BLUE CROSS/BLUE SHIELD | Admitting: Internal Medicine

## 2015-06-20 ENCOUNTER — Encounter: Payer: Self-pay | Admitting: Internal Medicine

## 2015-06-20 ENCOUNTER — Telehealth: Payer: Self-pay | Admitting: Family Medicine

## 2015-06-20 VITALS — BP 108/64 | HR 84 | Temp 98.0°F | Ht 65.0 in | Wt 197.0 lb

## 2015-06-20 DIAGNOSIS — M25531 Pain in right wrist: Secondary | ICD-10-CM | POA: Diagnosis not present

## 2015-06-20 DIAGNOSIS — M659 Synovitis and tenosynovitis, unspecified: Secondary | ICD-10-CM

## 2015-06-20 DIAGNOSIS — IMO0002 Reserved for concepts with insufficient information to code with codable children: Secondary | ICD-10-CM | POA: Insufficient documentation

## 2015-06-20 MED ORDER — MELOXICAM 15 MG PO TABS: 15.0000 mg | ORAL_TABLET | Freq: Every day | ORAL | Status: AC

## 2015-06-20 MED ORDER — HYDROCODONE-ACETAMINOPHEN 5-325 MG PO TABS: 1.0000 | ORAL_TABLET | Freq: Two times a day (BID) | ORAL | Status: AC | PRN

## 2015-06-20 NOTE — Progress Notes (Signed)
Pre visit review using our clinic review tool, if applicable. No additional management support is needed unless otherwise documented below in the visit note. 

## 2015-06-20 NOTE — Telephone Encounter (Signed)
Pt saw Dr. Jenny Reichmann to day for wrist problem, Jenny Reichmann want him to see Dr. Tamala Julian sometime next week but no open appt (pt was wondering if Dr. Tamala Julian willing to work him sometime sooner than 07/01/15). Please call pt

## 2015-06-20 NOTE — Patient Instructions (Signed)
Please take all new medication as prescribed - the anti-inflammatory, as well as the hydrocodone for breakthrough pain if needed  Please continue all other medications as before, and refills have been done if requested.  Please have the pharmacy call with any other refills you may need.  You will be contacted regarding the referral for: Dr Smith/sports medicine in this office  Please keep your appointments with your specialists as you may have planned  Please go to the XRAY Department in the Basement (go straight as you get off the elevator) for the x-ray testing as you can  You will be contacted by phone if any changes need to be made immediately.  Otherwise, you will receive a letter about your results with an explanation, but please check with MyChart first.  Please remember to sign up for MyChart if you have not done so, as this will be important to you in the future with finding out test results, communicating by private email, and scheduling acute appointments online when needed.

## 2015-06-20 NOTE — Progress Notes (Signed)
   Subjective:    Patient ID: Brian Herrera, male    DOB: 12-16-1985, 30 y.o.   MRN: [id number]  HPI  Here with acute onset recent right wrist pain/swelling since dec 26, but also with worsening swelling of the thunb and wrist as well, uses hands and wrists quite a bit for work. Pain now mod to severe  No fever, trauma or hx of arthritis, but right wrist pain has crackling to it.   Pt denies fever, wt loss, night sweats, loss of appetite, or other constitutional symptoms  Pt denies chest pain, increased sob or doe, wheezing, orthopnea, PND, increased LE swelling, palpitations, dizziness or syncope. Past Medical History  Diagnosis Date  . Chicken pox   . ADHD (attention deficit hyperactivity disorder)     Dx around age 53   No past surgical history on file.  reports that he has never smoked. He has never used smokeless tobacco. He reports that he drinks alcohol. He reports that he does not use illicit drugs. family history includes Alcohol abuse in his paternal grandfather; Arthritis in his maternal grandmother and mother; Diabetes in his father, paternal grandfather, and paternal grandmother; Hypertension in his maternal grandmother and paternal grandmother; Mental illness in his maternal grandfather; Stroke in his paternal grandfather. Allergies  Allergen Reactions  . Pennsaid [Diclofenac Sodium] Rash   Current Outpatient Prescriptions on File Prior to Visit  Medication Sig Dispense Refill  . cyclobenzaprine (FLEXERIL) 10 MG tablet Take 1 tablet (10 mg total) by mouth 2 (two) times daily as needed for muscle spasms. 20 tablet 0  . ibuprofen (ADVIL,MOTRIN) 200 MG tablet Take 400 mg by mouth every 6 (six) hours as needed for moderate pain.     No current facility-administered medications on file prior to visit.   Review of Systems  All otherwise neg per pt     Objective:   Physical Exam BP 108/64 mmHg  Pulse 84  Temp(Src) 98 F (36.7 C) (Oral)  Ht 5\' 5"  (1.651 m)  Wt 197 lb (89.359  kg)  BMI 32.78 kg/m2  SpO2 97% VS noted,  Constitutional: Pt appears in no significant distress HENT: Head: NCAT.  Right Ear: External ear normal.  Left Ear: External ear normal.  Eyes: . Pupils are equal, round, and reactive to light. Conjunctivae and EOM are normal Neck: Normal range of motion. Neck supple.  Cardiovascular: Normal rate and regular rhythm.   Pulmonary/Chest: Effort normal and breath sounds without rales or wheezing.  Right hand with visible tender/swollen first dorsal compartment, with pain and reduced ROM to wrist extension, o/w neurovasc intact Left wrist NT, no effusion, no tendon swelling Neurological: Pt is alert. Not confused , motor grossly intact Skin: Skin is warm. No rash, no LE edema Psychiatric: Pt behavior is normal. No agitation.     Assessment & Plan:

## 2015-06-22 NOTE — Assessment & Plan Note (Signed)
Also for right wrist xray, r/o DJD, nsaid prn

## 2015-06-22 NOTE — Assessment & Plan Note (Signed)
Mild to mod, for nsaid prn, hydrocodone for severe breakthrough, refer Dr Tamala Julian sports medicine

## 2015-06-24 ENCOUNTER — Ambulatory Visit (INDEPENDENT_AMBULATORY_CARE_PROVIDER_SITE_OTHER)
Admission: RE | Admit: 2015-06-24 | Discharge: 2015-06-24 | Disposition: A | Discharge status: Home / Self Care | Payer: BLUE CROSS/BLUE SHIELD | Source: Ambulatory Visit | Attending: Internal Medicine | Admitting: Internal Medicine

## 2015-06-24 DIAGNOSIS — M25531 Pain in right wrist: Secondary | ICD-10-CM

## 2015-06-24 DIAGNOSIS — M659 Synovitis and tenosynovitis, unspecified: Secondary | ICD-10-CM

## 2015-06-25 NOTE — Telephone Encounter (Signed)
lmovm for pt to return call.  

## 2015-06-25 NOTE — Telephone Encounter (Signed)
Spoke to pt, scheduled him for 12:30p.

## 2015-06-26 ENCOUNTER — Other Ambulatory Visit (INDEPENDENT_AMBULATORY_CARE_PROVIDER_SITE_OTHER): Payer: BLUE CROSS/BLUE SHIELD

## 2015-06-26 ENCOUNTER — Ambulatory Visit (INDEPENDENT_AMBULATORY_CARE_PROVIDER_SITE_OTHER): Payer: BLUE CROSS/BLUE SHIELD | Admitting: Family Medicine

## 2015-06-26 ENCOUNTER — Encounter: Payer: Self-pay | Admitting: Family Medicine

## 2015-06-26 VITALS — BP 126/84 | HR 80 | Wt 197.0 lb

## 2015-06-26 DIAGNOSIS — S62009A Unspecified fracture of navicular [scaphoid] bone of unspecified wrist, initial encounter for closed fracture: Secondary | ICD-10-CM | POA: Insufficient documentation

## 2015-06-26 DIAGNOSIS — S62001A Unspecified fracture of navicular [scaphoid] bone of right wrist, initial encounter for closed fracture: Secondary | ICD-10-CM | POA: Diagnosis not present

## 2015-06-26 DIAGNOSIS — M25531 Pain in right wrist: Secondary | ICD-10-CM | POA: Diagnosis not present

## 2015-06-26 NOTE — Progress Notes (Signed)
[facility] [address], [place] [address] Phone: [phone] Subjective:    I'm seeing this patient by the request  of:  Mauricio Po, FNP Dr. Jenny Reichmann  CC: Right wrist pain  [id number] Brian Herrera is a 30 y.o. male coming in with complaint of right wrist pain. Patient states this started approximately one month ago. Does not rib or any mechanism of injury. Patient states he woke up one morning and had pain. Initially had swelling. Was seen in urgent care facility and was given an injection. Help for hours but then seemed to worsen again and has stayed stable since. Patient states it only hurts with certain rotation or if he picks up something too heavy. Has a dull ache sometimes all the time. Rates the severity of pain a 7 out of 10. No numbness or weakness but is using the other hand more. 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 Patient was seen by another provider as well and x-rays were taken. These are independently visualized by me. There appeared to be between the proximal and mid pole of the scaphoid a possible line and could correspond with a occult fracture.    Past Medical History  Diagnosis Date  . Chicken pox   . ADHD (attention deficit hyperactivity disorder)     Dx around age 40   No past surgical history on file. Social History   Social History  . Marital Status: Single    Spouse Name: N/A  . Number of Children: N/A  . Years of Education: 16   Occupational History  . Nurse Tech    . Student     Nursing School   Social History Main Topics  . Smoking status: Never Smoker   . Smokeless tobacco: Never Used  . Alcohol Use: Yes     Comment: socially  . Drug Use: No  . Sexual Activity: Not Asked   Other Topics Concern  . None   Social History Narrative   Fun: Paintball, exercise   Denies religious beliefs effecting health care.    Allergies  Allergen Reactions  .  Pennsaid [Diclofenac Sodium] Rash   Family History  Problem Relation Age of Onset  . Arthritis Mother   . Diabetes Father   . Arthritis Maternal Grandmother   . Hypertension Maternal Grandmother   . Mental illness Maternal Grandfather   . Hypertension Paternal Grandmother   . Diabetes Paternal Grandmother   . Alcohol abuse Paternal Grandfather   . Stroke Paternal Grandfather   . Diabetes Paternal Grandfather     Past medical history, social, surgical and family history all reviewed in electronic medical record.  No pertanent information unless stated regarding to the chief complaint.   Review of Systems: No headache, visual changes, nausea, vomiting, diarrhea, constipation, dizziness, abdominal pain, skin rash, fevers, chills, night sweats, weight loss, swollen lymph nodes, body aches, joint swelling, muscle aches, chest pain, shortness of breath, mood changes.   Objective Blood pressure 126/84, pulse 80, weight 197 lb (89.359 kg), SpO2 98 %.  General: No apparent distress alert and oriented x3 mood and affect normal, dressed appropriately.  HEENT: Pupils equal, extraocular movements intact  Respiratory: Patient's speak in full sentences and does not appear short of breath  Cardiovascular: No lower extremity edema, non tender, no erythema  Skin: Warm dry intact with no signs of infection or rash on extremities or on axial skeleton.  Abdomen: Soft  nontender  Neuro: Cranial nerves II through XII are intact, neurovascularly intact in all extremities with 2+ DTRs and 2+ pulses.  Lymph: No lymphadenopathy of posterior or anterior cervical chain or axillae bilaterally.  Gait normal with good balance and coordination.  MSK:  Non tender with full range of motion and good stability and symmetric strength and tone of shoulders, elbows,  hip, knee and ankles bilaterally.  Wrist: Right Inspection normal with no visible erythema or swelling. ROM smooth and normal with good flexion and extension  and ulnar/radial deviation that is symmetrical with opposite wrist. Palpation is normal over metacarpals, navicular, lunate, and TFCC; tendons without tenderness/ swelling Severe snuffbox tenderness mild clicking with stress of the scaphoid lunate ligament No tenderness over Canal of Guyon. Strength 5/5 in all directions without pain. Negative Finkelstein, tinel's and phalens. Negative Watson's test. Overall but mild clicking   MSK US performed of: Right wrist This study was ordered, performed, and interpreted by Charlann Boxer D.O.  Wrist: All extensor compartments visualized and tendons all normal in appearance without fraying, tears, or sheath effusions. No effusion seen. TFCC intact. Scapholunate ligament does have significant hypoechoic changes. Scaphoid bone itself has what appears to be a cortical defect. No significant hypoechoic changes surrounding it. No callus formation. Carpal tunnel visualized and median nerve area normal, flexor tendons all normal in appearance without fraying, tears, or sheath effusions. Power doppler signal normal.  IMPRESSION:  Proximal pole scaphoid fracture     Impression and Recommendations:     This case required medical decision making of moderate complexity.      Note: This dictation was prepared with Dragon dictation along with smaller phrase technology. Any transcriptional errors that result from this process are unintentional.

## 2015-06-26 NOTE — Patient Instructions (Signed)
Good to see you Ice 20 minutes 2 times daily. Usually after activity and before bed. Wear brace day and night for next 3 weeks Tylenol 500mg  3 times daily Vitamin D 2000-4000 IU daily See me again in 3 weeks and we will make sure you are doing better.   Scaphoid Fracture, Wrist A fracture is a break in the bone. The bone you have broken often does not show up as a fracture on x-ray until later on in the healing phase. This bone is called the scaphoid bone. With this bone, your caregiver will often cast or splint your wrist as though it is fractured, even if a fracture is not seen on the x-ray. This is often done with wrist injuries in which there is tenderness at the base of the thumb. An x-ray at 1-3 weeks after your injury may confirm this fracture. A cast or splint is used to protect and keep your injured bone in good position for healing. The cast or splint will be on generally for about 6 to 16 weeks, depending on your health, age, the fracture location and how quickly you heal. Another name for the scaphoid bone is the navicular bone. HOME CARE INSTRUCTIONS   To lessen the swelling and pain, keep the injured part elevated above your heart while sitting or lying down.  Apply ice to the injury for 15-20 minutes, 03-04 times per day while awake, for 2 days. Put the ice in a plastic bag and place a thin towel between the bag of ice and your cast.  If you have a plaster or fiberglass cast or splint:  Do not try to scratch the skin under the cast using sharp or pointed objects.  Check the skin around the cast every day. You may put lotion on any red or sore areas.  Keep your cast or splint dry and clean.  If you have a plaster splint:  Wear the splint as directed.  You may loosen the elastic bandage around the splint if your fingers become numb, tingle, or turn cold or blue.  If you have been put in a removable splint, wear and use as directed.  Do not put pressure on any part of your  cast or splint; it may deform or break. Rest your cast or splint only on a pillow the first 24 hours until it is fully hardened.  Your cast or splint can be protected during bathing with a plastic bag. Do not lower the cast or splint into water.  Only take over-the-counter or prescription medicines for pain, discomfort, or fever as directed by your caregiver.  If your caregiver has given you a follow up appointment, it is very important to keep that appointment. Not keeping the appointment could result in chronic pain and decreased function. If there is any problem keeping the appointment, you must call back to this facility for assistance. SEEK IMMEDIATE MEDICAL CARE IF:   Your cast gets damaged, wet or breaks.  You have continued severe pain or more swelling than you did before the cast or splint was put on.  Your skin or nails below the injury turn blue or gray, or feel cold or numb.  You have tingling or burning pain in your fingers or increasing pain with movement of your fingers   This information is not intended to replace advice given to you by your health care provider. Make sure you discuss any questions you have with your health care provider.   Document Released: 05/08/2002  Document Revised: 08/10/2011 Document Reviewed: 11/28/2014 Elsevier Interactive Patient Education [employer].

## 2015-06-26 NOTE — Assessment & Plan Note (Signed)
Do believe the patient does have a scaphoid fracture. Seems to be more of the proximal pole. Possible injury to the scaphoid lunate ligament as well. Increasing Doppler flow noted. Patient will try conservative therapy with bracing, Tylenol, icing. Patient will come back and see me again in 3 weeks and we will advance accordingly. Any worsening symptoms such as numbness we may need to consider advanced imaging. Do not think repeat x-rays would be beneficial at this time but possibly in 3 weeks.

## 2015-07-01 ENCOUNTER — Ambulatory Visit: Payer: BLUE CROSS/BLUE SHIELD | Admitting: Family Medicine

## 2015-07-17 ENCOUNTER — Encounter: Payer: Self-pay | Admitting: Family Medicine

## 2015-07-17 ENCOUNTER — Other Ambulatory Visit: Payer: Self-pay

## 2015-07-17 ENCOUNTER — Ambulatory Visit (INDEPENDENT_AMBULATORY_CARE_PROVIDER_SITE_OTHER)
Admission: RE | Admit: 2015-07-17 | Discharge: 2015-07-17 | Disposition: A | Discharge status: Home / Self Care | Payer: BLUE CROSS/BLUE SHIELD | Source: Ambulatory Visit | Attending: Family Medicine | Admitting: Family Medicine

## 2015-07-17 ENCOUNTER — Ambulatory Visit (INDEPENDENT_AMBULATORY_CARE_PROVIDER_SITE_OTHER): Payer: BLUE CROSS/BLUE SHIELD | Admitting: Family Medicine

## 2015-07-17 VITALS — BP 126/84 | HR 100 | Ht 65.0 in | Wt 198.0 lb

## 2015-07-17 DIAGNOSIS — S62001A Unspecified fracture of navicular [scaphoid] bone of right wrist, initial encounter for closed fracture: Secondary | ICD-10-CM | POA: Diagnosis not present

## 2015-07-17 NOTE — Progress Notes (Signed)
Pre visit review using our clinic review tool, if applicable. No additional management support is needed unless otherwise documented below in the visit note. 

## 2015-07-17 NOTE — Patient Instructions (Signed)
Good to see you Sorry you are hurting Xray today and we will get you a MRI.  Depending on what we find we may need to send you to a hand specialist I will keep you updated.

## 2015-07-17 NOTE — Assessment & Plan Note (Signed)
I believe the patient does have a proximal pole. Does not seem to be healing on ultrasound. X-rays ordered today for further evaluation. Concern for possible avascular necrosis. MRI does correspond with this patient will be sent to hand specialist for likely surgical intervention. Patient will keep with immobilization until then.

## 2015-07-17 NOTE — Progress Notes (Signed)
[facility] [place], [place] [address] Phone: [phone] Subjective:      CC: Right wrist pain f/u   [id number] Brian Herrera is a 30 y.o. male coming in with complaint of right wrist pain. Patient is following up with a scaphoid fracture. Patient's x-rays that were independent believe visualized by me previously did show a potential for a mid to proximal scaphoid fracture that seemed to be hairline nondisplaced. Ultrasound confirmed this. Patient was put in a mobilizing brace. Patient states he is not made any significant improvement. Patient is attempting to alter school and unfortunately will have to be decelerated if he continues to this more time. Patient states that the pain is so severe that it is stopping her from daily activities and can wake him up at night. States that he is feeling some weakness in the right thumb as well.    Past Medical History  Diagnosis Date  . Chicken pox   . ADHD (attention deficit hyperactivity disorder)     Dx around age 22   No past surgical history on file. Social History   Social History  . Marital Status: Single    Spouse Name: N/A  . Number of Children: N/A  . Years of Education: 16   Occupational History  . Nurse Tech    . Student     [employer]ing [employer]   Social History Main Topics  . Smoking status: Never Smoker   . Smokeless tobacco: Never Used  . Alcohol Use: Yes     Comment: socially  . Drug Use: No  . Sexual Activity: Not Asked   Other Topics Concern  . None   Social History Narrative   Fun: Paintball, exercise   Denies religious beliefs effecting health care.    Allergies  Allergen Reactions  . Pennsaid [Diclofenac Sodium] Rash   Family History  Problem Relation Age of Onset  . Arthritis Mother   . Diabetes Father   . Arthritis Maternal Grandmother   . Hypertension Maternal Grandmother   . Mental illness Maternal Grandfather   . Hypertension Paternal Grandmother     . Diabetes Paternal Grandmother   . Alcohol abuse Paternal Grandfather   . Stroke Paternal Grandfather   . Diabetes Paternal Grandfather     Past medical history, social, surgical and family history all reviewed in electronic medical record.  No pertanent information unless stated regarding to the chief complaint.   Review of Systems: No headache, visual changes, nausea, vomiting, diarrhea, constipation, dizziness, abdominal pain, skin rash, fevers, chills, night sweats, weight loss, swollen lymph nodes, body aches, joint swelling, muscle aches, chest pain, shortness of breath, mood changes.   Objective Blood pressure 126/84, pulse 100, height 5\' 5"  (1.651 m), weight 198 lb (89.812 kg), SpO2 97 %.  General: No apparent distress alert and oriented x3 mood and affect normal, dressed appropriately.  HEENT: Pupils equal, extraocular movements intact  Respiratory: Patient's speak in full sentences and does not appear short of breath  Cardiovascular: No lower extremity edema, non tender, no erythema  Skin: Warm dry intact with no signs of infection or rash on extremities or on axial skeleton.  Abdomen: Soft nontender  Neuro: Cranial nerves II through XII are intact, neurovascularly intact in all extremities with 2+ DTRs and 2+ pulses.  Lymph: No lymphadenopathy of posterior or anterior cervical chain or axillae bilaterally.  Gait normal with good balance and coordination.  MSK:  Non tender with full range of  motion and good stability and symmetric strength and tone of shoulders, elbows,  hip, knee and ankles bilaterally.  Wrist: Right Inspection normal with no visible erythema or swelling. Region motion is smooth the patient is having some tightness likely secondary to immobilization. Really tender over the anatomical snuffbox as well as over the scaphoid crepitus noted that seems worse than previous exam. Significant stress in pain when putting stress on the scaphoid lunate ligament No  tenderness over Canal of [place]. Strength 5/5 in all directions without pain. Negative Finkelstein, tinel's and phalens. Negative Watson's test. Overall but mild clicking   MSK US performed of: Right wrist This study was ordered, performed, and interpreted by Charlann Boxer D.O.  Wrist: All extensor compartments visualized and tendons all normal in appearance without fraying, tears, or sheath effusions. No effusion seen. TFCC intact. Scapholunate ligament does have improvement in the hypoechoic changes. Scaphoid bone itself has what appears to be a cortical defect. No callus formation noted. Carpal tunnel visualized and median nerve area normal, flexor tendons all normal in appearance without fraying, tears, or sheath effusions. Power doppler signal normal.  IMPRESSION:  Proximal pole scaphoid fracture with no interval healing.   Impression and Recommendations:     This case required medical decision making of moderate complexity.      Note: This dictation was prepared with Dragon dictation along with smaller phrase technology. Any transcriptional errors that result from this process are unintentional.

## 2015-07-18 ENCOUNTER — Ambulatory Visit
Admission: RE | Admit: 2015-07-18 | Discharge: 2015-07-18 | Disposition: A | Discharge status: Home / Self Care | Payer: BLUE CROSS/BLUE SHIELD | Source: Ambulatory Visit | Attending: Family Medicine | Admitting: Family Medicine

## 2015-07-18 ENCOUNTER — Telehealth: Payer: Self-pay | Admitting: Family Medicine

## 2015-07-18 ENCOUNTER — Other Ambulatory Visit: Payer: Self-pay | Admitting: Family Medicine

## 2015-07-18 ENCOUNTER — Other Ambulatory Visit: Payer: Self-pay

## 2015-07-18 DIAGNOSIS — M25531 Pain in right wrist: Secondary | ICD-10-CM

## 2015-07-18 DIAGNOSIS — Z77018 Contact with and (suspected) exposure to other hazardous metals: Secondary | ICD-10-CM

## 2015-07-18 DIAGNOSIS — S62001A Unspecified fracture of navicular [scaphoid] bone of right wrist, initial encounter for closed fracture: Secondary | ICD-10-CM

## 2015-07-18 MED ORDER — PREDNISONE 50 MG PO TABS: ORAL_TABLET | ORAL | Status: AC

## 2015-07-18 MED ORDER — AMOXICILLIN-POT CLAVULANATE 875-125 MG PO TABS: 1.0000 | ORAL_TABLET | Freq: Two times a day (BID) | ORAL | Status: AC

## 2015-07-18 NOTE — Telephone Encounter (Signed)
Spoke with patient.  Answered questions.

## 2015-07-18 NOTE — Telephone Encounter (Signed)
Patient would like call back in regards to messaged Dr. Tamala Julian sent him through [facility].

## 2015-07-22 ENCOUNTER — Other Ambulatory Visit: Payer: Self-pay

## 2015-07-22 ENCOUNTER — Telehealth: Payer: Self-pay | Admitting: Family Medicine

## 2015-07-22 DIAGNOSIS — M25531 Pain in right wrist: Secondary | ICD-10-CM

## 2015-07-22 NOTE — Telephone Encounter (Signed)
Patient wants a call back on a possible referral.  States Dr. Tamala Julian would know what its about.

## 2015-07-22 NOTE — Telephone Encounter (Signed)
Discussed with patient.  Brian Herrera MRi remarkably normal expcet for minl muscle injury no sign for why the pain.  We will have him see a hand specialist to see what would be next step Please refer.

## 2016-04-03 ENCOUNTER — Other Ambulatory Visit (HOSPITAL_COMMUNITY): Payer: Self-pay | Admitting: Orthopedic Surgery

## 2016-04-03 DIAGNOSIS — M25532 Pain in left wrist: Secondary | ICD-10-CM

## 2016-04-07 ENCOUNTER — Ambulatory Visit (HOSPITAL_COMMUNITY)
Admission: RE | Admit: 2016-04-07 | Discharge: 2016-04-07 | Disposition: A | Discharge status: Home / Self Care | Payer: BLUE CROSS/BLUE SHIELD | Source: Ambulatory Visit | Attending: Orthopedic Surgery | Admitting: Orthopedic Surgery

## 2016-04-07 DIAGNOSIS — M25532 Pain in left wrist: Secondary | ICD-10-CM | POA: Diagnosis not present

## 2016-04-07 DIAGNOSIS — R937 Abnormal findings on diagnostic imaging of other parts of musculoskeletal system: Secondary | ICD-10-CM | POA: Diagnosis not present

## 2016-04-07 MED ORDER — IOPAMIDOL (ISOVUE-300) INJECTION 61%
50.0000 mL | Freq: Once | INTRAVENOUS | Status: AC | PRN
Start: 2016-04-07 — End: 2016-04-07
  Administered 2016-04-07: 10 mL

## 2017-12-14 IMAGING — RF DG ARTHROGRAM WRIST*L*
3 series · 3 of 3 positions shown · non-contrast
Comparison: Left wrist radiographs - 09/11/2015;

INDICATION: Left wrist pain

EXAM:
ARTHROGRAM OF THE LEFT WRIST
TECHNIQUE: Informed written consent was obtained from the patient after a
discussion of the risks, benefits and alternatives to treatment. The
patient was placed supine on the fluoroscopy table and the left
upper extremity placed by the patient's side, palm down. A rolled
towel was placed about the palmar aspect of the wrist to encourage a
minimal amount of wrist flexion. The radial carpal joint at the
level of the proximal scaphoid was marked fluoroscopically. The
volar tissues about the wrist were prepped and draped in usual
sterile fashion. A timeout was performed prior to the initiation of
the procedure.

[Series 1: fluoro_arthrogram_singleshot_bw · 0.10mm/px · 1 of 1 slices shown (1 of 2)]
[im 1/1]
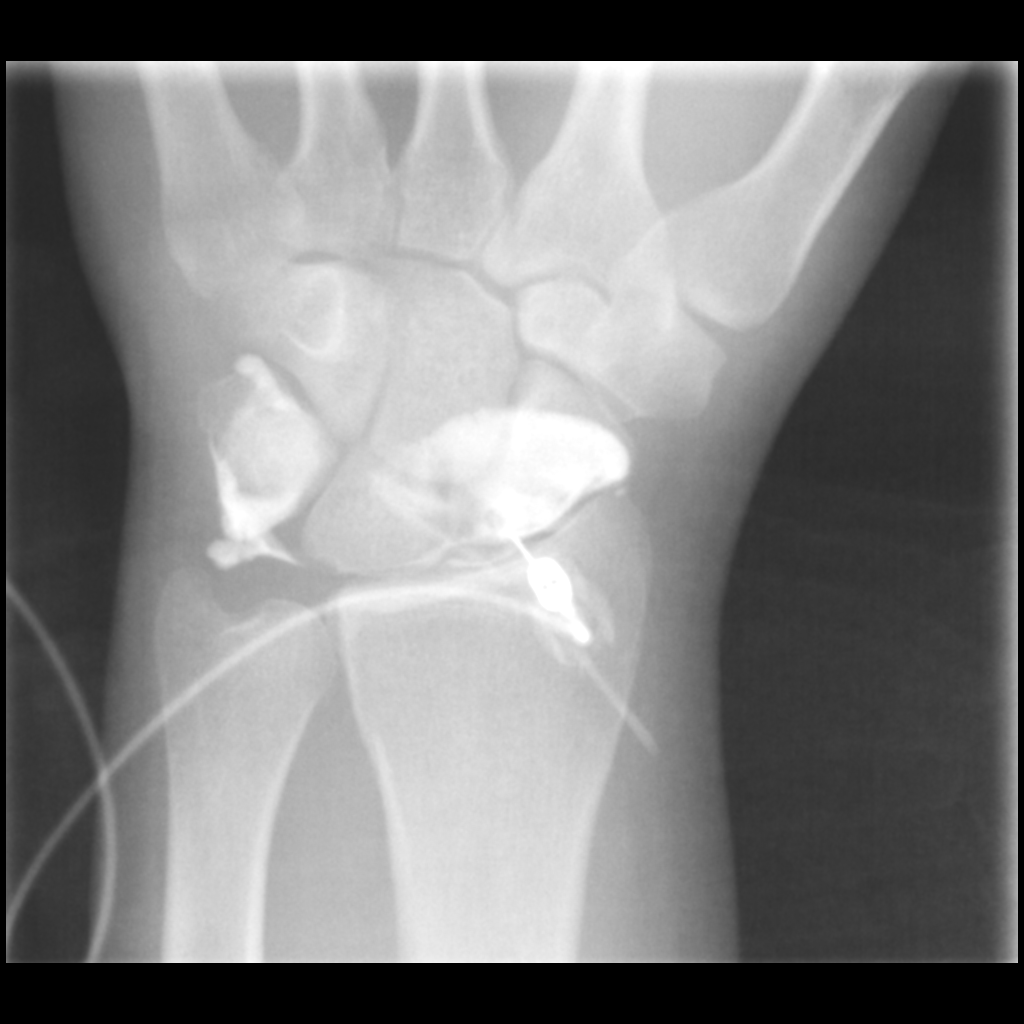

[Series 2: fluoro_arthrogram_singleshot_bw · 0.10mm/px · 1 of 1 slices shown (2 of 2)]
[im 1/1]
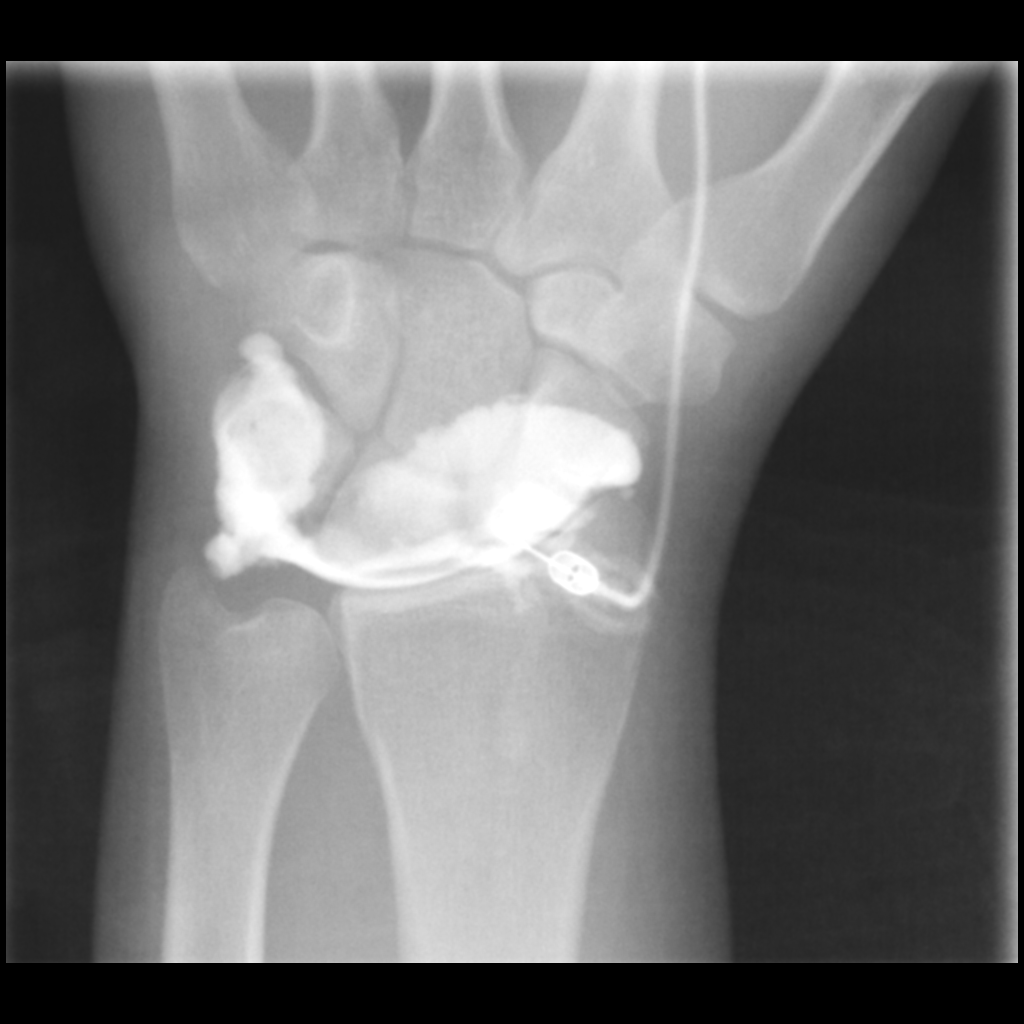

[Series 3: cp_standard · 0.10mm/px · 1 of 1 slices shown]
[im 1/1]
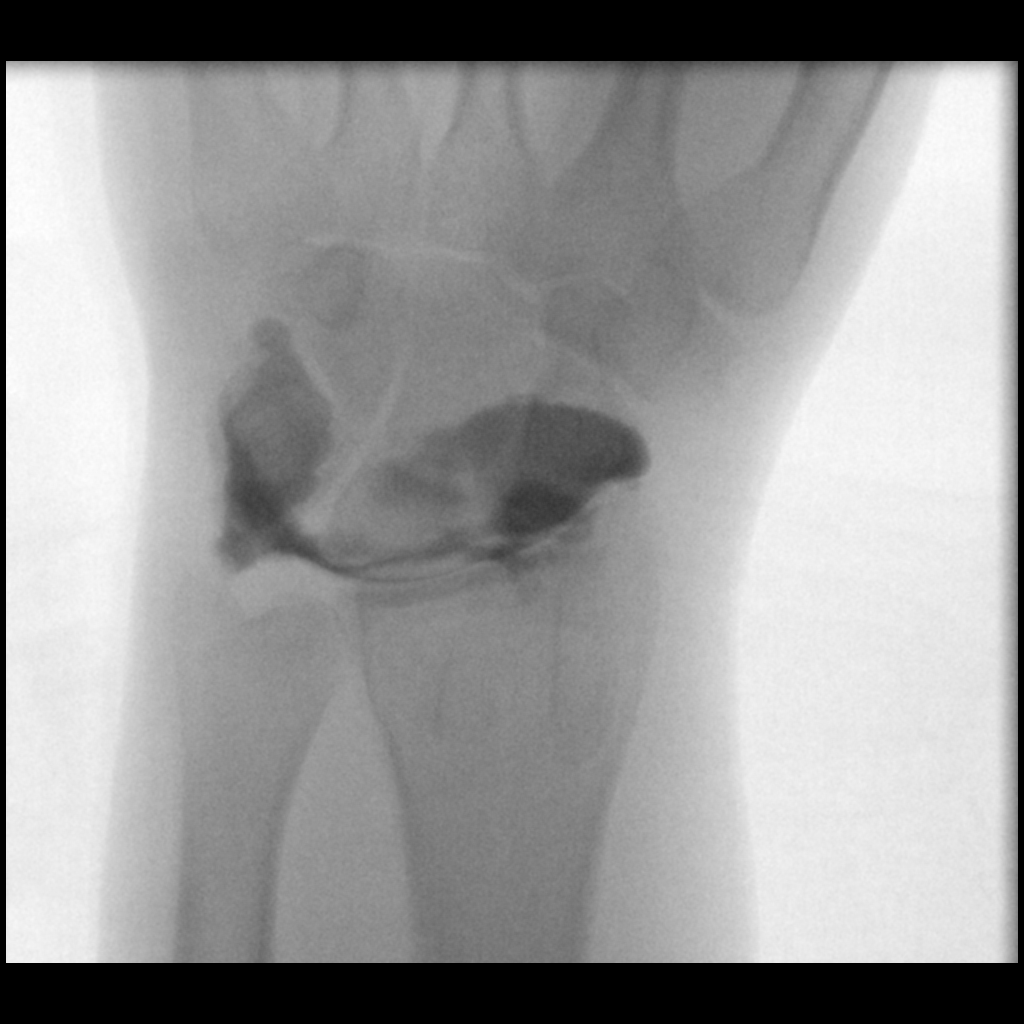

[3 of 3 positions shown; findings below may reference images not displayed]

09/06/2015 ;
07/05/2015

MEDICATIONS:
None

FLUOROSCOPY TIME:  1 minutes, 12 seconds

Fluoroscopy Time:  42

Radiation Exposure Index (if provided by the fluoroscopic device):
0.4 mGy

COMPLICATIONS:
None immediate
After the overlying soft tissues were anesthetized with 1%
lidocaine, a 25 gauge butterfly needle was advanced into the
radiocarpal joint targeting the inferior [DATE] of the scaphoid.
Limited contrast injection confirmed intra-articular positioning and
a total of approximately 3 mL of a mixture of 15 mL of Usovue-5SS, 5
mL of 1% Lidocaine and 0.1 mL of Multihance was injected into the
joint space. Several fluoroscopic images were saved during injection
for procedural documentation purposes.

The procedure was terminated. The needle was withdrawn and a
dressing was placed. The patient tolerated the procedure well
without immediate post procedural complication. The patient was
escorted to MRI.
FINDINGS: Fluoroscopic imaging demonstrates successful intra-articular
injection of a mixture of Usovue-5SS lidocaine and Multihance.

A small amount of contrast was noted extravasated about the dorsal
aspect of the radiocarpal joint.
IMPRESSION: Successful fluoroscopic guided arthrogram of the left wrist.

## 2020-04-01 ENCOUNTER — Emergency Department (HOSPITAL_COMMUNITY): Payer: 59

## 2020-04-01 ENCOUNTER — Encounter (HOSPITAL_COMMUNITY): Payer: Self-pay | Admitting: Emergency Medicine

## 2020-04-01 ENCOUNTER — Emergency Department (HOSPITAL_COMMUNITY)
Admission: EM | Admit: 2020-04-01 | Discharge: 2020-04-01 | Disposition: A | Discharge status: Home / Self Care | Payer: 59 | Attending: Emergency Medicine | Admitting: Emergency Medicine

## 2020-04-01 DIAGNOSIS — R079 Chest pain, unspecified: Secondary | ICD-10-CM | POA: Diagnosis not present

## 2020-04-01 LAB — BASIC METABOLIC PANEL
Anion gap: 10 (ref 5–15)
BUN: 25 mg/dL — ABNORMAL HIGH (ref 6–20)
CO2: 25 mmol/L (ref 22–32)
Calcium: 9.5 mg/dL (ref 8.9–10.3)
Chloride: 105 mmol/L (ref 98–111)
Creatinine, Ser: 0.88 mg/dL (ref 0.61–1.24)
GFR, Estimated: >60 mL/min (ref >60)
Glucose, Bld: 95 mg/dL (ref 70–99)
Potassium: 3.8 mmol/L (ref 3.5–5.1)
Sodium: 140 mmol/L (ref 135–145)

## 2020-04-01 LAB — CBC
HCT: 44.8 % (ref 39.0–52.0)
Hemoglobin: 15.2 g/dL (ref 13.0–17.0)
MCH: 29.6 pg (ref 26.0–34.0)
MCHC: 33.9 g/dL (ref 30.0–36.0)
MCV: 87.3 fL (ref 80.0–100.0)
Platelets: 297 10*3/uL (ref 150–400)
RBC: 5.13 MIL/uL (ref 4.22–5.81)
RDW: 12 % (ref 11.5–15.5)
WBC: 10.5 10*3/uL (ref 4.0–10.5)
nRBC: 0 % (ref 0.0–0.2)

## 2020-04-01 LAB — TROPONIN I (HIGH SENSITIVITY)
Troponin I (High Sensitivity): <2 ng/L (ref <18)
Troponin I (High Sensitivity): <2 ng/L (ref <18)

## 2020-04-01 NOTE — Discharge Instructions (Addendum)
Thank you for allowing me to care for you today in the Emergency Department.   Your work-up today was reassuring.  The results will be available in [facility].  You can follow-up with primary care if symptoms persist.  You can try applying a lidocaine patch, which is available over-the-counter, to see if this will improve your symptoms.  Return to the emergency department if you develop chest pain again becomes constant, with shortness of breath, if you pass out, if you have diaphoresis, the pain radiates up your neck or down your arm, or if you have other new, concerning symptoms.

## 2020-04-01 NOTE — ED Triage Notes (Signed)
Pt reports transient L sided chest pain for a little over a year. States that it bothered him more than usual yesterday. He Denies SOB on exertion or radiation. A&Ox4. Ambulatory.

## 2020-04-01 NOTE — ED Provider Notes (Signed)
[facility] DEPT Provider Note   CSN: [id number] Arrival date & time: 04/01/20  0016     History Chief Complaint  Patient presents with  . Chest Pain    Brian Herrera is a 34 y.o. male with a history of ADHD who presents to the emergency department with a chief complaint of chest pain.  The patient reports that he has been having transient, nonexertional, nonradiating, episodes "twinges" of chest pain for the last year.  Episodes may last minutes to several hours.  Symptoms are not worse with exertion, eating, or positional changes.  No other known aggravating or alleviating factors.  He denies shortness of breath, diaphoresis, nausea, vomiting, neck pain, arm pain, fever, chills, palpitations, leg swelling, abdominal pain, nausea, vomiting, diarrhea, constipation, dizziness, or lightheadedness.  He has never been able to reproduce the pain on exam.  He reports that his mother had a cardiac event that was initially thought to have been an MI, but was later told by 2 other cardiologist that it was not an MI and was perhaps a coronary vasospasm.  No other family history of cardiovascular disease.  No treatment prior to arrival.  No drug use or tobacco use.  He drinks alcohol socially.  No family history of VTE.  No recent long travel.  No recent trauma or injuries.  The history is provided by the patient and medical records. No language interpreter was used.       Past Medical History:  Diagnosis Date  . ADHD (attention deficit hyperactivity disorder)    Dx around age 23  . Chicken pox     Patient Active Problem List   Diagnosis Date Noted  . Scaphoid fracture, wrist, closed 06/26/2015  . Tenosynovitis of finger, hand, or wrist 06/20/2015  . Wrist pain, right 06/20/2015  . Routine general medical examination at a health care facility 12/18/2014  . Lipoma 12/18/2014    History reviewed. No pertinent surgical history.     Family History    Problem Relation Age of Onset  . Arthritis Mother   . Diabetes Father   . Arthritis Maternal Grandmother   . Hypertension Maternal Grandmother   . Mental illness Maternal Grandfather   . Hypertension Paternal Grandmother   . Diabetes Paternal Grandmother   . Alcohol abuse Paternal Grandfather   . Stroke Paternal Grandfather   . Diabetes Paternal Grandfather     Social History   Tobacco Use  . Smoking status: Never Smoker  . Smokeless tobacco: Never Used  Substance Use Topics  . Alcohol use: Yes    Comment: socially  . Drug use: No    Home Medications Prior to Admission medications   Medication Sig Start Date End Date Taking? Authorizing Provider  amoxicillin-clavulanate (AUGMENTIN) 875-125 MG tablet Take 1 tablet by mouth 2 (two) times daily. 07/18/15   Lyndal Pulley, DO  cyclobenzaprine (FLEXERIL) 10 MG tablet Take 1 tablet (10 mg total) by mouth 2 (two) times daily as needed for muscle spasms. 05/24/15   Hedges, Dellis Filbert, PA-C  HYDROcodone-acetaminophen (NORCO/VICODIN) 5-325 MG tablet Take 1 tablet by mouth 2 (two) times daily as needed for moderate pain. 06/20/15   Biagio Borg, MD  ibuprofen (ADVIL,MOTRIN) 200 MG tablet Take 400 mg by mouth every 6 (six) hours as needed for moderate pain.    [provider]  meloxicam (MOBIC) 15 MG tablet Take 1 tablet (15 mg total) by mouth daily. 06/20/15   Biagio Borg, MD  predniSONE ([place])  50 MG tablet Take one tablet daily 07/18/15   Lyndal Pulley, DO    Allergies    Pennsaid [diclofenac sodium]  Review of Systems   Review of Systems  Constitutional: Negative for appetite change, chills, fatigue and fever.  HENT: Negative for congestion and sore throat.   Eyes: Negative for visual disturbance.  Respiratory: Negative for cough, chest tightness, shortness of breath and wheezing.   Cardiovascular: Positive for chest pain. Negative for palpitations and leg swelling.  Gastrointestinal: Negative for abdominal pain,  blood in stool, constipation, diarrhea, nausea and vomiting.  Genitourinary: Positive for urgency. Negative for dysuria and flank pain.  Musculoskeletal: Negative for back pain, myalgias and neck stiffness.  Skin: Negative for color change, rash and wound.  Allergic/Immunologic: Negative for immunocompromised state.  Neurological: Negative for dizziness, seizures, syncope, weakness, numbness and headaches.  Psychiatric/Behavioral: Negative for confusion.    Physical Exam Updated Vital Signs BP 125/70 (BP Location: Left Arm)   Pulse (!) 58   Temp 98 F (36.7 C) (Oral)   Resp 20   Ht 5\' 5"  (1.651 m)   Wt 86.2 kg   SpO2 97%   BMI 31.62 kg/m   Physical Exam Vitals and nursing note reviewed.  Constitutional:      General: He is not in acute distress.    Appearance: He is well-developed. He is not ill-appearing, toxic-appearing or diaphoretic.  HENT:     Head: Normocephalic.     Nose: Nose normal. No congestion or rhinorrhea.     Mouth/Throat:     Mouth: Mucous membranes are moist.     Pharynx: No oropharyngeal exudate or posterior oropharyngeal erythema.  Eyes:     Conjunctiva/sclera: Conjunctivae normal.  Cardiovascular:     Rate and Rhythm: Normal rate and regular rhythm.     Pulses: Normal pulses.     Heart sounds: Normal heart sounds. No murmur heard.  No friction rub. No gallop.   Pulmonary:     Effort: Pulmonary effort is normal. No respiratory distress.     Breath sounds: No stridor. No wheezing, rhonchi or rales.  Chest:     Chest wall: No tenderness.  Abdominal:     General: There is no distension.     Palpations: Abdomen is soft. There is no mass.     Tenderness: There is no abdominal tenderness. There is no right CVA tenderness, left CVA tenderness, guarding or rebound.     Hernia: No hernia is present.  Musculoskeletal:        General: No tenderness.     Cervical back: Neck supple.     Right lower leg: No edema.     Left lower leg: No edema.  Skin:     General: Skin is warm and dry.     Capillary Refill: Capillary refill takes less than 2 seconds.     Coloration: Skin is not jaundiced or pale.     Findings: No lesion or rash.  Neurological:     General: No focal deficit present.     Mental Status: He is alert.  Psychiatric:        Behavior: Behavior normal.     ED Results / Procedures / Treatments   Labs (all labs ordered are listed, but only abnormal results are displayed) Labs Reviewed  BASIC METABOLIC PANEL - Abnormal; Notable for the following components:      Result Value   BUN 25 (*)    All other components within normal limits  CBC  TROPONIN I (HIGH SENSITIVITY)  TROPONIN I (HIGH SENSITIVITY)    EKG None  Radiology DG Chest 2 View  Result Date: 04/01/2020 CLINICAL DATA:  Chest pain EXAM: CHEST - 2 VIEW COMPARISON:  05/24/2015 FINDINGS: The heart size and mediastinal contours are within normal limits. Both lungs are clear. The visualized skeletal structures are unremarkable. IMPRESSION: No active cardiopulmonary disease. Electronically Signed   By: Fidela Salisbury MD   On: 04/01/2020 01:33    Procedures Procedures (including critical care time)  Medications Ordered in ED Medications - No data to display  ED Course  I have reviewed the triage vital signs and the nursing notes.  Pertinent labs & imaging results that were available during my care of the patient were reviewed by me and considered in my medical decision making (see chart for details).    MDM Rules/Calculators/A&P                          34 year old male with a history of ADHD who presents with transient episodes of chest pain for the last year.  No other associated symptoms.  Vital signs are normal.  EKG with normal sinus rhythm  Labs have been independently reviewed and evaluated by me.  Delta troponin is not elevated.  No electrolyte derangements.  CBC is unremarkable. HEAR score is 1.   Imaging has been independently reviewed and evaluated  by me.  Chest x-ray is unremarkable.   Chest pain is not reproducible on exam.  I have a low suspicion for ACS.  He is PERC negative for PE.  Doubt aortic dissection.  Doubt pericarditis, myocarditis, tension pneumothorax, community-acquired pneumonia, epigastric pain, pancreatitis, atypical cholecystitis, or costochondritis.  He plans to follow-up with his PCP for further evaluation.  I think this is a reasonable plan.  He is hemodynamically stable and in no acute distress.  Safe for discharge home with outpatient follow-up as indicated.  Final Clinical Impression(s) / ED Diagnoses Final diagnoses:  Chest pain, unspecified type    Rx / DC Orders ED Discharge Orders    None       Joanne Gavel, PA-C 04/01/20 0559    Molpus, Jenny Reichmann, MD 04/01/20 [phone]

## 2021-07-09 ENCOUNTER — Other Ambulatory Visit: Payer: Self-pay

## 2021-07-09 ENCOUNTER — Emergency Department (HOSPITAL_COMMUNITY)
Admission: EM | Admit: 2021-07-09 | Discharge: 2021-07-09 | Disposition: A | Discharge status: Home / Self Care | Payer: 59 | Attending: Emergency Medicine | Admitting: Emergency Medicine

## 2021-07-09 ENCOUNTER — Emergency Department (HOSPITAL_COMMUNITY): Payer: 59

## 2021-07-09 ENCOUNTER — Encounter (HOSPITAL_COMMUNITY): Payer: Self-pay | Admitting: Emergency Medicine

## 2021-07-09 DIAGNOSIS — R0789 Other chest pain: Secondary | ICD-10-CM | POA: Diagnosis not present

## 2021-07-09 DIAGNOSIS — R079 Chest pain, unspecified: Secondary | ICD-10-CM | POA: Diagnosis present

## 2021-07-09 LAB — BASIC METABOLIC PANEL
Anion gap: 6 (ref 5–15)
BUN: 23 mg/dL — ABNORMAL HIGH (ref 6–20)
CO2: 23 mmol/L (ref 22–32)
Calcium: 8.9 mg/dL (ref 8.9–10.3)
Chloride: 108 mmol/L (ref 98–111)
Creatinine, Ser: 0.98 mg/dL (ref 0.61–1.24)
GFR, Estimated: >60 mL/min (ref >60)
Glucose, Bld: 94 mg/dL (ref 70–99)
Potassium: 3.9 mmol/L (ref 3.5–5.1)
Sodium: 137 mmol/L (ref 135–145)

## 2021-07-09 LAB — CBC
HCT: 45.2 % (ref 39.0–52.0)
Hemoglobin: 15.1 g/dL (ref 13.0–17.0)
MCH: 29.4 pg (ref 26.0–34.0)
MCHC: 33.4 g/dL (ref 30.0–36.0)
MCV: 87.9 fL (ref 80.0–100.0)
Platelets: 293 10*3/uL (ref 150–400)
RBC: 5.14 MIL/uL (ref 4.22–5.81)
RDW: 12.3 % (ref 11.5–15.5)
WBC: 6.8 10*3/uL (ref 4.0–10.5)
nRBC: 0 % (ref 0.0–0.2)

## 2021-07-09 LAB — TROPONIN I (HIGH SENSITIVITY)
Troponin I (High Sensitivity): <2 ng/L (ref <18)
Troponin I (High Sensitivity): <2 ng/L (ref <18)

## 2021-07-09 NOTE — ED Triage Notes (Signed)
Reports L-sided chest pain for the past few years, this most frequent episode started last Tuesday, has been more consistent and intense than previous episodes. Denies SOB, diaphoresis or N/V. Reports frequent belching. Antacids w/o relief.

## 2021-07-09 NOTE — ED Provider Triage Note (Signed)
Emergency Medicine Provider Triage Evaluation Note  Brian Herrera , a 36 y.o. male  was evaluated in triage.  Pt complains of intermittent CP for 3 years.  Left sided pinpoint sharp. Non pleuritic. Non exertional. Episodes can last minutes to hours. No aggravating or mitigating factors.   Has had workup before here.   He is an ER RN   Mother hx of cardiac issues unknown specifics.   Review of Systems  Positive: CP Negative: N, V, cough  Physical Exam  BP 119/86    Pulse 76    Temp 97.7 F (36.5 C) (Oral)    Resp 20    Ht 5\' 5"  (1.651 m)    Wt 87 kg    SpO2 100%    BMI 31.92 kg/m  Gen:   Awake, no distress   Resp:  Normal effort  MSK:   Moves extremities without difficulty  Other:    Medical Decision Making  Medically screening exam initiated at 10:53 AM.  Appropriate orders placed.  Brian Herrera was informed that the remainder of the evaluation will be completed by another provider, this initial triage assessment does not replace that evaluation, and the importance of remaining in the ED until their evaluation is complete.  CP workup   Brian Herrera, [place] 07/09/21 1058

## 2021-07-09 NOTE — ED Provider Notes (Signed)
[facility] DEPT Provider Note   CSN: [id number] Arrival date & time: 07/09/21  1017     History  No chief complaint on file.   Brian Herrera is a 36 y.o. male.  HPI Patient presents with concern of pain in the left inferior lateral chest.  He notes that he has had episodes going back years, has been seen, evaluated, had cardiology evaluation about 1 year ago with reassuring results.  Now over the past few days he has had more persistent pain than usual, though in a similar location, similar severity.  No associated dyspnea, fever, nausea, vomiting, other abdominal pain.  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    Home Medications Prior to Admission medications   Medication Sig Start Date End Date Taking? Authorizing Provider  amoxicillin-clavulanate (AUGMENTIN) 875-125 MG tablet Take 1 tablet by mouth 2 (two) times daily. 07/18/15   Lyndal Pulley, DO  cyclobenzaprine (FLEXERIL) 10 MG tablet Take 1 tablet (10 mg total) by mouth 2 (two) times daily as needed for muscle spasms. 05/24/15   Hedges, Dellis Filbert, PA-C  HYDROcodone-acetaminophen (NORCO/VICODIN) 5-325 MG tablet Take 1 tablet by mouth 2 (two) times daily as needed for moderate pain. 06/20/15   Biagio Borg, MD  ibuprofen (ADVIL,MOTRIN) 200 MG tablet Take 400 mg by mouth every 6 (six) hours as needed for moderate pain.    [provider]  meloxicam (MOBIC) 15 MG tablet Take 1 tablet (15 mg total) by mouth daily. 06/20/15   Biagio Borg, MD  predniSONE (DELTASONE) 50 MG tablet Take one tablet daily 07/18/15   Lyndal Pulley, DO      Allergies    Pennsaid [diclofenac sodium]    Review of Systems   Review of Systems  Constitutional:        Per HPI, otherwise negative  HENT:         Per HPI, otherwise negative  Respiratory:         Per HPI, otherwise negative  Cardiovascular:        Per HPI, otherwise negative  Gastrointestinal:   Negative for vomiting.  Endocrine:       Negative aside from HPI  Genitourinary:        Neg aside from HPI   Musculoskeletal:        Per HPI, otherwise negative  Skin: Negative.   Neurological:  Negative for syncope.   Physical Exam Updated Vital Signs BP 109/80    Pulse (!) 51    Temp 97.7 F (36.5 C) (Oral)    Resp 18    Ht 5\' 5"  (1.651 m)    Wt 87 kg    SpO2 100%    BMI 31.92 kg/m  Physical Exam Vitals and nursing note reviewed.  Constitutional:      General: He is not in acute distress.    Appearance: He is well-developed.  HENT:     Head: Normocephalic and atraumatic.  Eyes:     Conjunctiva/sclera: Conjunctivae normal.  Cardiovascular:     Rate and Rhythm: Normal rate and regular rhythm.  Pulmonary:     Effort: Pulmonary effort is normal. No respiratory distress.     Breath sounds: No stridor.  Abdominal:     General: There is no distension.  Skin:    General: Skin is warm and dry.  Neurological:     Mental Status: He is alert and oriented to person, place, and  time.    ED Results / Procedures / Treatments   Labs (all labs ordered are listed, but only abnormal results are displayed) Labs Reviewed  BASIC METABOLIC PANEL - Abnormal; Notable for the following components:      Result Value   BUN 23 (*)    All other components within normal limits  CBC  TROPONIN I (HIGH SENSITIVITY)  TROPONIN I (HIGH SENSITIVITY)    EKG EKG Interpretation  Date/Time:  Wednesday July 09 2021 10:25:22 EST Ventricular Rate:  68 PR Interval:  149 QRS Duration: 94 QT Interval:  383 QTC Calculation: 408 R Axis:   18 Text Interpretation: Sinus rhythm Baseline wander Otherwise within normal limits Confirmed by Carmin Muskrat [phone]) on 07/09/2021 2:03:13 PM  Radiology DG Chest 2 View  Result Date: 07/09/2021 CLINICAL DATA:  Left-sided chest pain with increasing intensity. EXAM: CHEST - 2 VIEW COMPARISON:  04/01/2020. FINDINGS: Trachea is midline. Heart size normal. Lungs are  clear. No pleural fluid. Visualized abdomen is unremarkable. IMPRESSION: No acute findings. Electronically Signed   By: Lorin Picket M.D.   On: 07/09/2021 10:47    Procedures Procedures    Medications Ordered in ED Medications - No data to display  ED Course/ Medical Decision Making/ A&P  This patient presents to the ED for concern of chest pain, this involves an extensive number of treatment options, and is a complaint that carries with it a high risk of complications and morbidity.  The differential diagnosis includes atypical ACS, pneumothorax, pneumonia, intercostal strain.   Co morbidities that complicate the patient evaluation  None   Social Determinants of Health:      Additional history obtained:  Additional history and/or information obtained from chart review x-ray last year unremarkable chest External records from outside source obtained and reviewed including as above x-ray   After the initial evaluation, orders, including: Labs, x-ray were initiated.  Patient placed on Cardiac and Pulse-Oximetry Monitors. The patient was maintained on a cardiac monitor.  The cardiac monitored showed an rhythm of 50s, sinus unremarkable The patient was also maintained on pulse oximetry. The readings were typically 99% unremarkable  On repeat evaluation of the patient stayed the same  Lab Tests:  I personally interpreted labs.  The pertinent results include: 2 normal troponin, heart score 1, no evidence for ACS  Imaging Studies ordered:  I independently visualized and interpreted imaging which showed unremarkable chest x-ray I agree with the radiologist interpretation   Dispostion / Final MDM:  After consideration of the diagnostic results and the patient's response to treatment, he is appropriate for discharge with outpatient follow-up.  Per request he received a GI referral, though patient's presentation is more consistent with chest wall etiology, with unremarkable  EKG, heart score of 1, 2 normal troponins, low suspicion for atypical ACS, no evidence for pneumonia, no evidence of bacteremia, sepsis.   Final Clinical Impression(s) / ED Diagnoses Final diagnoses:  Atypical chest pain     Carmin Muskrat, MD 07/09/21 1652

## 2021-07-09 NOTE — Discharge Instructions (Addendum)
As discussed, your evaluation today has been largely reassuring.  But, it is important that you monitor your condition carefully, and do not hesitate to return to the ED if you develop new, or concerning changes in your condition.  In the interim, please use ibuprofen, 400 mg, 3 times daily.  Otherwise, please follow-up with either your physician or one of our local gastroenterology practices for appropriate ongoing care.

## 2021-12-08 IMAGING — CR DG CHEST 2V
2 series · 2 of 2 positions shown · non-contrast
Comparison: 05/24/2015

CLINICAL DATA: Chest pain

EXAM:
CHEST - 2 VIEW

[w chest pa]
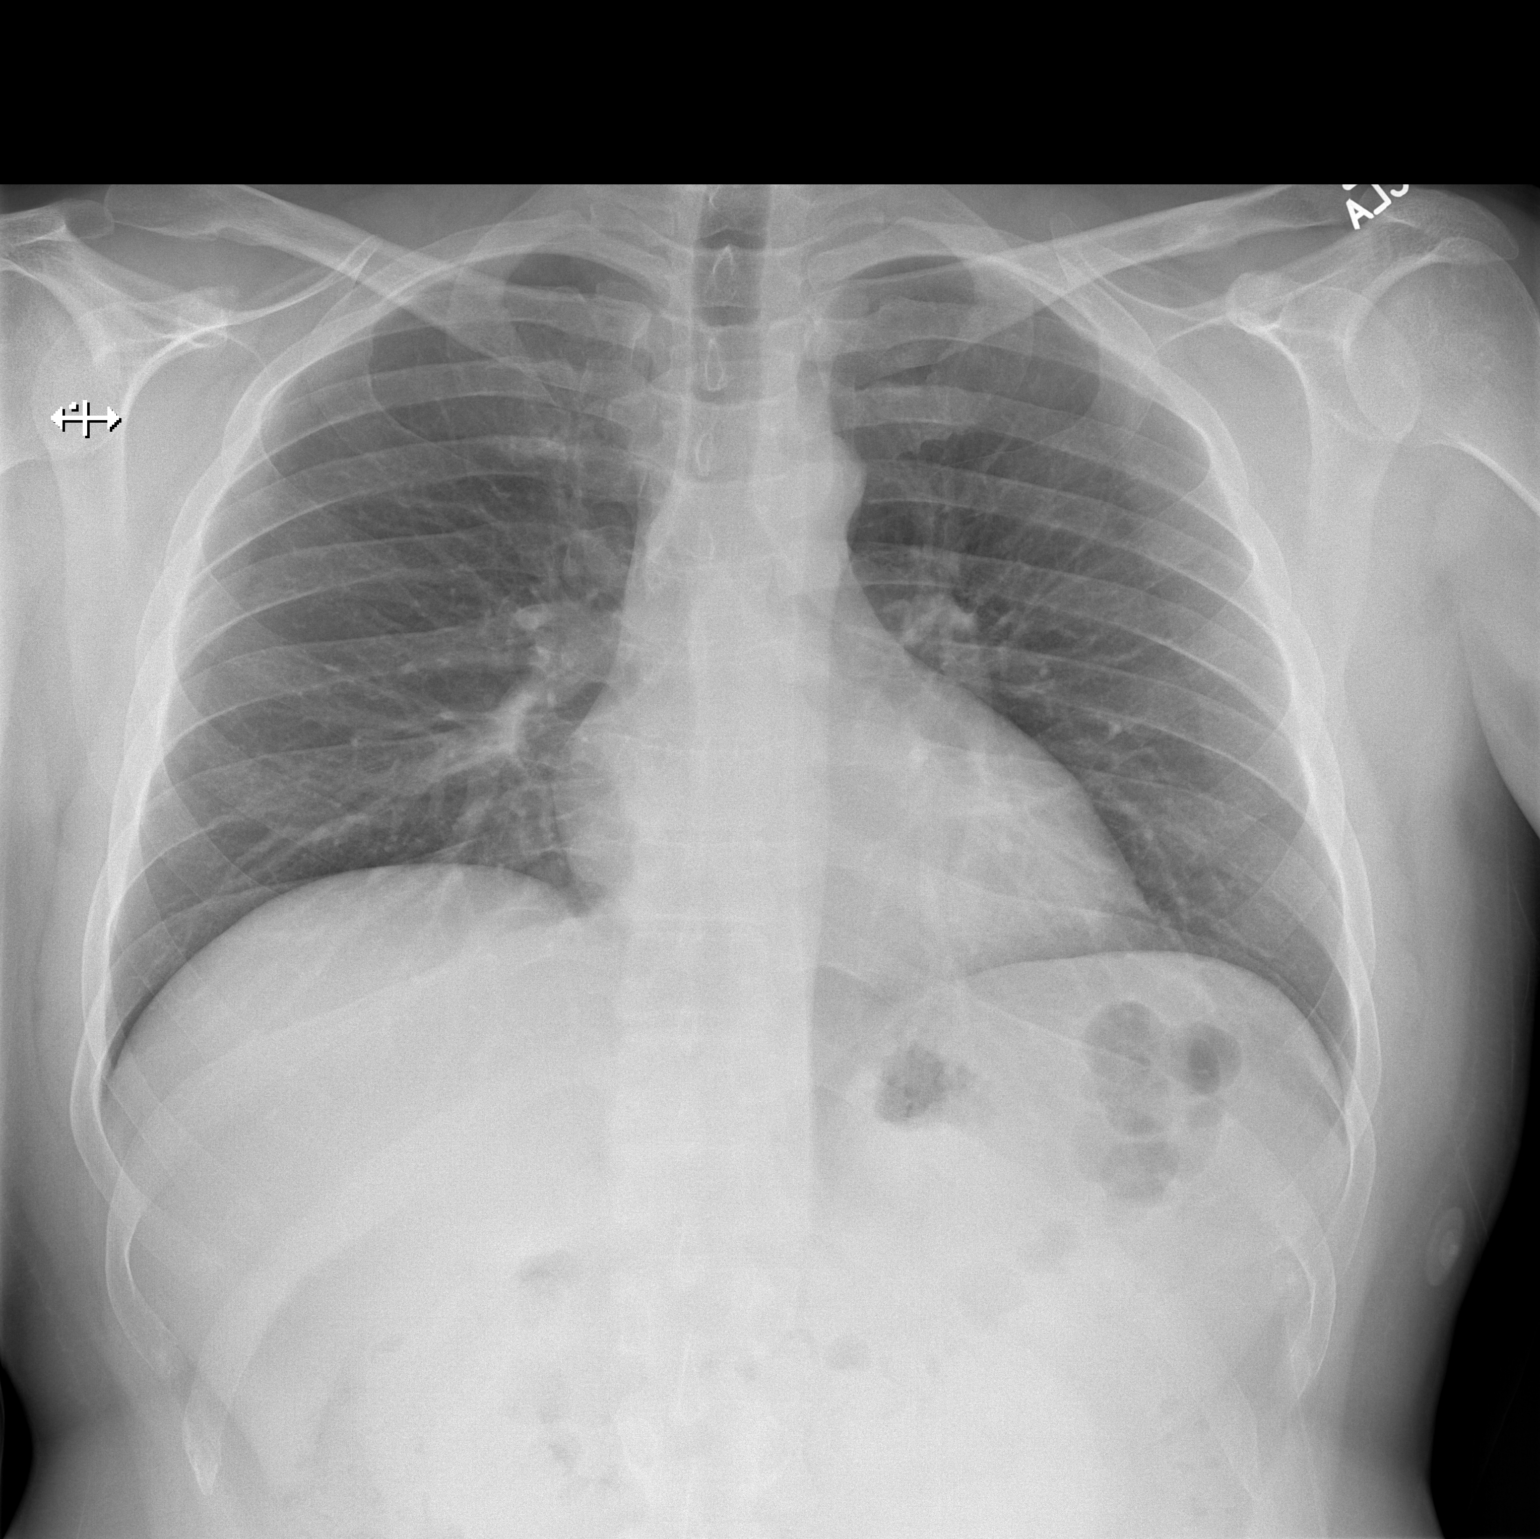

[w chest lat]
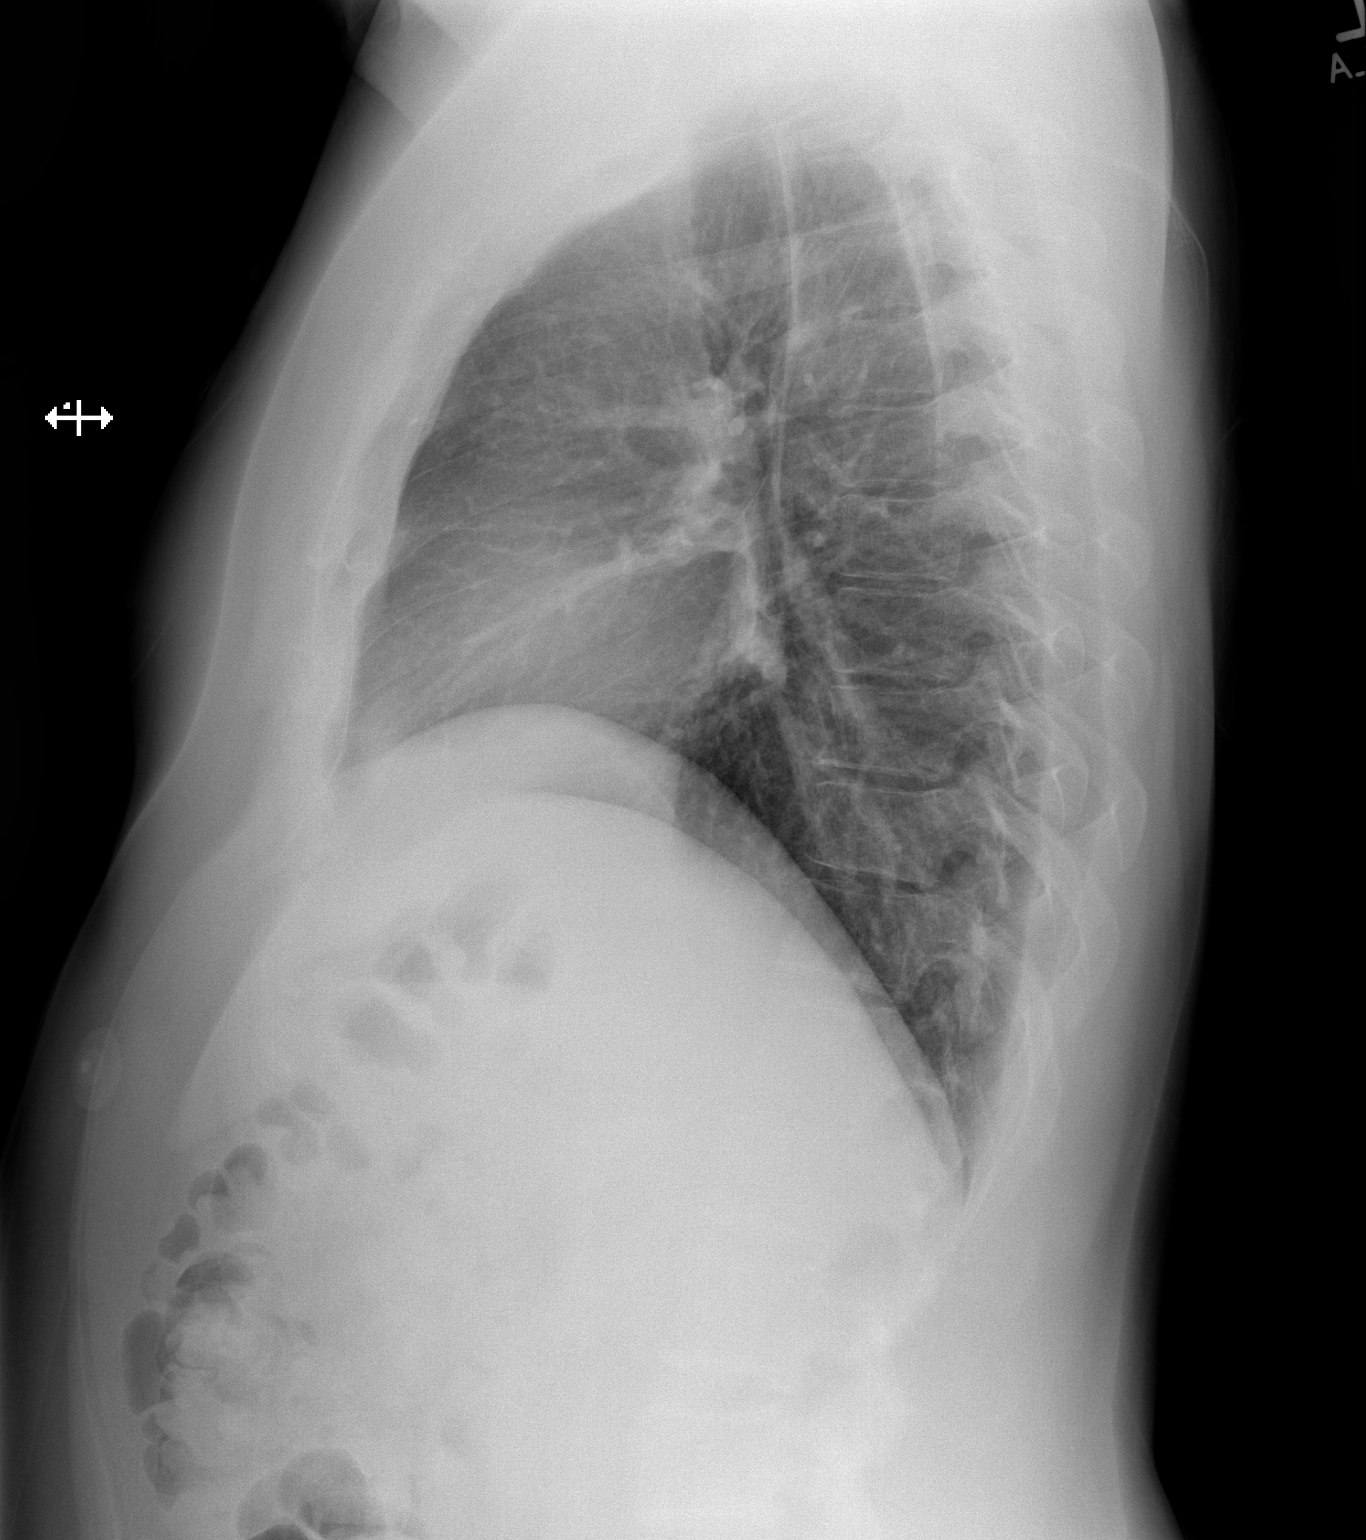

[2 of 2 positions shown; findings below may reference images not displayed]

FINDINGS: The heart size and mediastinal contours are within normal limits.
Both lungs are clear. The visualized skeletal structures are
unremarkable.
IMPRESSION: No active cardiopulmonary disease.
